# Patient Record
Sex: Female | Born: 1960 | Race: White | Hispanic: No | Marital: Married | Smoking: Never smoker
Health system: Southern US, Community
[De-identification: ages and names within clinical notes are randomized; demographics above are authoritative.]

## PROBLEM LIST (undated history)

## (undated) DIAGNOSIS — T7840XA Allergy, unspecified, initial encounter: Secondary | ICD-10-CM

## (undated) DIAGNOSIS — A64 Unspecified sexually transmitted disease: Secondary | ICD-10-CM

## (undated) DIAGNOSIS — F32A Depression, unspecified: Secondary | ICD-10-CM

## (undated) DIAGNOSIS — IMO0002 Reserved for concepts with insufficient information to code with codable children: Secondary | ICD-10-CM

## (undated) DIAGNOSIS — F329 Major depressive disorder, single episode, unspecified: Secondary | ICD-10-CM

## (undated) DIAGNOSIS — H409 Unspecified glaucoma: Secondary | ICD-10-CM

## (undated) DIAGNOSIS — G43909 Migraine, unspecified, not intractable, without status migrainosus: Secondary | ICD-10-CM

## (undated) DIAGNOSIS — R011 Cardiac murmur, unspecified: Secondary | ICD-10-CM

## (undated) HISTORY — DX: Unspecified glaucoma: H40.9

## (undated) HISTORY — DX: Unspecified sexually transmitted disease: A64

## (undated) HISTORY — DX: Reserved for concepts with insufficient information to code with codable children: IMO0002

## (undated) HISTORY — DX: Allergy, unspecified, initial encounter: T78.40XA

## (undated) HISTORY — DX: Cardiac murmur, unspecified: R01.1

## (undated) HISTORY — DX: Depression, unspecified: F32.A

## (undated) HISTORY — DX: Migraine, unspecified, not intractable, without status migrainosus: G43.909

## (undated) HISTORY — DX: Major depressive disorder, single episode, unspecified: F32.9

## (undated) HISTORY — PX: HERNIA REPAIR: SHX51

---

## 2006-04-14 ENCOUNTER — Other Ambulatory Visit: Admission: RE | Admit: 2006-04-14 | Discharge: 2006-04-14 | Payer: Self-pay | Admitting: Obstetrics & Gynecology

## 2006-04-24 ENCOUNTER — Encounter: Admission: RE | Admit: 2006-04-24 | Discharge: 2006-04-24 | Payer: Self-pay | Admitting: Obstetrics & Gynecology

## 2013-04-20 ENCOUNTER — Ambulatory Visit (INDEPENDENT_AMBULATORY_CARE_PROVIDER_SITE_OTHER): Payer: BC Managed Care – PPO | Admitting: Certified Nurse Midwife

## 2013-04-20 ENCOUNTER — Encounter: Payer: Self-pay | Admitting: Certified Nurse Midwife

## 2013-04-20 VITALS — BP 98/60 | Ht 63.25 in | Wt 126.0 lb

## 2013-04-20 DIAGNOSIS — Z1211 Encounter for screening for malignant neoplasm of colon: Secondary | ICD-10-CM

## 2013-04-20 DIAGNOSIS — N951 Menopausal and female climacteric states: Secondary | ICD-10-CM

## 2013-04-20 DIAGNOSIS — Z0000 Encounter for general adult medical examination without abnormal findings: Secondary | ICD-10-CM

## 2013-04-20 LAB — FOLLICLE STIMULATING HORMONE: FSH: 38.1 m[IU]/mL

## 2013-04-20 LAB — POCT URINALYSIS DIPSTICK
Bilirubin, UA: NEGATIVE
Blood, UA: NEGATIVE
Nitrite, UA: NEGATIVE
Urobilinogen, UA: NEGATIVE

## 2013-04-20 NOTE — Progress Notes (Signed)
52 y.o. MarriedCaucasian female   [id number] here for annual exam. Periods from April 2013 until 12-30-12 no period and February and March periods 2014 heavy. No period for April as of yet. History of irregular menses in past 3 years. No contraception use throughout married life.  Patient has been having night sweats, and occasional hot flashes, with some mood swings.  No thoughts of self harm or others.  Last pap and aex normal per patient.  Complaining of vaginal dryness in the past year.  No other health problems today.     Patient's last menstrual period was 03/10/2013.          Sexually active: yes  The current method of family planning is none.    Exercising: yes  walking, elliptical & weights Last mammogram: 2006 Last pap:4/13 no abnormal Last BMD: none Alcohol: 2-3 glasses of wine a week Tobacco: none Colonoscopy: none   No health maintenance topics applied.  Family History  Problem Relation Age of Onset  . Hypertension Mother   . Hypertension Father   . Thyroid disease Sister   . Cancer Maternal Grandmother 80    colon  . Thyroid disease Maternal Grandmother     thyroid removed  . Stroke Maternal Grandfather 62    There is no problem list on file for this patient.   Past Medical History  Diagnosis Date  . Depression   . Dyspareunia     occ  . Heart murmur   . STD (sexually transmitted disease)     HSV 2    Past Surgical History  Procedure Laterality Date  . Hernia repair      Allergies: Review of patient's allergies indicates no known allergies.  Current Outpatient Prescriptions  Medication Sig Dispense Refill  . B Complex Vitamins (B COMPLEX PO) Take by mouth daily.      . Cholecalciferol (VITAMIN D PO) Take by mouth daily.      . Ibuprofen (ADVIL PO) Take by mouth as needed.       No current facility-administered medications for this visit.    ROS: A comprehensive review of systems was negative.  Exam:    BP 98/60  Ht 5' 3.25" (1.607 m)  Wt 126  lb (57.153 kg)  BMI 22.13 kg/m2  LMP 03/10/2013 Weight change: @WEIGHTCHANGE @ Last 3 height recordings:  Ht Readings from Last 3 Encounters:  04/20/13 5' 3.25" (1.607 m)   General appearance: alert and cooperative Head: Normocephalic, without obvious abnormality, atraumatic Neck: no adenopathy, supple, symmetrical, trachea midline and thyroid not enlarged, symmetric, no tenderness/mass/nodules Lungs: clear to auscultation bilaterally Breasts: normal appearance, no masses or tenderness, No nipple retraction or dimpling Heart: regular rate and rhythm no murmur heard today Abdomen: soft, non-tender; bowel sounds normal; no masses,  no organomegaly Extremities: extremities normal, atraumatic, no cyanosis or edema Skin: Skin color, texture, turgor normal. No rashes or lesions Lymph nodes: Cervical, supraclavicular, and axillary nodes normal. no inguinal nodes palpated Neurologic: Alert and oriented X 3, normal strength and tone. Normal symmetric reflexes. Normal coordination and gait   Pelvic: External genitalia:  no lesions and well estrogenized              Urethra: normal appearing urethra with no masses, tenderness or lesions              Bartholins and Skenes: normal, Bartholin's, Urethra, Skene's normal                 Vagina: normal appearing vagina with  normal color and discharge, no lesions              Cervix: thin prep PAP obtained              Pap taken: yes        Bimanual Exam:  Uterus:  uterus is normal size, shape, consistency and nontender                                      Adnexa:    not indicated and normal adnexa in size, nontender and no masses                                      Rectovaginal: Confirms                                      Anus:  normal sphincter tone, no lesions       A: Normal well woman exam Contraception: none Perimenopausal with irregular cycles Depression? Or perimenopausal mood changes   P: Reviewed health and wellness pertinent to  exam   Discussed perimenopausal changes and cycle expectations given.  Questions addressed.  Given menses calendar with normal and abnormal parameters to report. Lab:FSH, TSH Discussed feelings of depression signs and symptoms, patient not sure, but feels she has not had this type of mood changes.  Discusssed risks and benefits of medication, patient does not feel needed at this time. Given information on counseling resources.  Patient to advise if symptoms increase and come in to discuss.  Instructed to seek immediate assistance if thoughts of self harm or others. Pap yearly or as indicated Mammogram yearly Colonoscopy risks and benefits discussed.  Request referral for scheduling.  Rv as above and prn     An After Visit Summary was printed and given to the patient.  Reviewed, TL

## 2013-04-20 NOTE — Patient Instructions (Signed)

## 2013-04-27 ENCOUNTER — Telehealth: Payer: Self-pay | Admitting: Certified Nurse Midwife

## 2013-04-27 NOTE — Telephone Encounter (Signed)
Patient returning Sally's call re: results. Kennon Rounds out sick today.

## 2013-04-27 NOTE — Telephone Encounter (Signed)
Let her know that the blood work is normal, but I would like her Vitamin level to be 50 range.  Start on 800 IU daily.

## 2013-04-28 NOTE — Telephone Encounter (Signed)
We discussed that the symptoms maybe perimenopausal changes, the [facility] does not reflect menopause only where here level is.  We also discussed depressions symptoms, which she was to schedule a follow up visit to discuss possible medication use.

## 2013-04-28 NOTE — Telephone Encounter (Signed)
Patient given response per D. Leonard,CNM, patient states understands and will call back when ready for any medication given.

## 2013-04-28 NOTE — Telephone Encounter (Signed)
Patient notified of lab results and explained the Vitamin D level also. Patient is concerned of her FSH level . Do you have any response for this for patient since she is having some menopausal symptoms.SUE

## 2013-05-07 ENCOUNTER — Encounter: Payer: Self-pay | Admitting: Certified Nurse Midwife

## 2014-03-14 ENCOUNTER — Telehealth (HOSPITAL_COMMUNITY): Payer: Self-pay | Admitting: *Deleted

## 2014-03-14 NOTE — Telephone Encounter (Signed)
Opened in error

## 2014-10-21 ENCOUNTER — Other Ambulatory Visit (INDEPENDENT_AMBULATORY_CARE_PROVIDER_SITE_OTHER): Payer: BC Managed Care – PPO

## 2014-10-21 ENCOUNTER — Encounter: Payer: Self-pay | Admitting: Internal Medicine

## 2014-10-21 ENCOUNTER — Ambulatory Visit (INDEPENDENT_AMBULATORY_CARE_PROVIDER_SITE_OTHER): Payer: BC Managed Care – PPO | Admitting: Internal Medicine

## 2014-10-21 VITALS — BP 108/56 | HR 61 | Temp 98.0°F | Resp 16 | Ht 63.0 in | Wt 125.0 lb

## 2014-10-21 DIAGNOSIS — Z0000 Encounter for general adult medical examination without abnormal findings: Secondary | ICD-10-CM

## 2014-10-21 DIAGNOSIS — B009 Herpesviral infection, unspecified: Secondary | ICD-10-CM

## 2014-10-21 DIAGNOSIS — Z23 Encounter for immunization: Secondary | ICD-10-CM

## 2014-10-21 LAB — CBC
HCT: 40 % (ref 36.0–46.0)
Hemoglobin: 13 g/dL (ref 12.0–15.0)
MCHC: 32.6 g/dL (ref 30.0–36.0)
MCV: 88.4 fl (ref 78.0–100.0)
PLATELETS: 205 10*3/uL (ref 150.0–400.0)
RBC: 4.52 Mil/uL (ref 3.87–5.11)
RDW: 12.9 % (ref 11.5–15.5)
WBC: 5.3 10*3/uL (ref 4.0–10.5)

## 2014-10-21 LAB — BASIC METABOLIC PANEL
BUN: 16 mg/dL (ref 6–23)
CHLORIDE: 104 meq/L (ref 96–112)
CO2: 30 mEq/L (ref 19–32)
Calcium: 9.9 mg/dL (ref 8.4–10.5)
Creatinine, Ser: 0.7 mg/dL (ref 0.4–1.2)
GFR: 101.7 mL/min (ref >60.00)
Glucose, Bld: 88 mg/dL (ref 70–99)
POTASSIUM: 4.4 meq/L (ref 3.5–5.1)
SODIUM: 139 meq/L (ref 135–145)

## 2014-10-21 LAB — LIPID PANEL
CHOL/HDL RATIO: 3
CHOLESTEROL: 191 mg/dL (ref 0–200)
HDL: 59.1 mg/dL (ref >39.00)
LDL Cholesterol: 114 mg/dL — ABNORMAL HIGH (ref 0–99)
NonHDL: 131.9
TRIGLYCERIDES: 92 mg/dL (ref 0.0–149.0)
VLDL: 18.4 mg/dL (ref 0.0–40.0)

## 2014-10-21 MED ORDER — ACYCLOVIR 400 MG PO TABS: 400.0000 mg | ORAL_TABLET | Freq: Every day | ORAL | Status: DC

## 2014-10-21 MED ORDER — ESTRADIOL 0.1 MG/GM VA CREA: 1.0000 | TOPICAL_CREAM | Freq: Every day | VAGINAL | Status: DC

## 2014-10-21 NOTE — Patient Instructions (Addendum)
We have sent in estrogen cream that you can use daily to help regain some moisture in your lining to help with some of the discomfort.   For the herpes we have sent in acyclovir. You can use the medicine just when you think a flare is coming or everyday for prevention of flares. To take it for a flare take 2 pills (800 mg) twice a day for 5 days starting when you feel like you are going to have a flare.   If you want to take it daily to prevent an outbreak take 1 pill daily.   Come back in about 1 year or sooner if you have problems or are sick.   We will check your blood today and call you with the results. We will send in for the colon cancer screening you can do from home and you should be getting that in the next couple of weeks.

## 2014-10-21 NOTE — Assessment & Plan Note (Signed)
Estrogen vaginal cream for dryness in the setting of menopause. Flu shot declined. Cologuard ordered for colon cancer screening. She does not wish to get mammogram due to the discomfort and advised her that this would be recommended.

## 2014-10-21 NOTE — Progress Notes (Signed)
Pre visit review using our clinic review tool, if applicable. No additional management support is needed unless otherwise documented below in the visit note. 

## 2014-10-21 NOTE — Assessment & Plan Note (Signed)
She does have recurrent flares and rx for acyclovir with instruction for either daily usage or high dose with flares triggered by her. Her partner already has herpes so prevention of spread is not an issue.

## 2014-10-21 NOTE — Progress Notes (Signed)
   Subjective:    Patient ID: Sheila Cox, female    DOB: 1961-06-24, 53 y.o.   MRN: [id number]  HPI The patient is a 53 YO female who is coming in today to establish care. She has PMH of herpes. She is going through menopause currently and having some vaginal dryness which is causing some pain with intercourse which is bothersome to her. She also has about 1 flare of her herpes per month which is bothersome to her. She denies current outbreak. She denies chest pain, SOB, abdominal pain, constipation, diarrhea. She has never had colonoscopy and is not sure she wants to do it.   Review of Systems  Constitutional: Negative for fever, activity change, appetite change and fatigue.  HENT: Negative.   Eyes: Negative.   Respiratory: Negative for cough, chest tightness, shortness of breath and wheezing.   Cardiovascular: Negative for chest pain, palpitations and leg swelling.  Gastrointestinal: Negative for abdominal pain, diarrhea, constipation and abdominal distention.  Genitourinary: Positive for genital sores and dyspareunia.  Musculoskeletal: Negative.   Skin: Negative.   Neurological: Negative.   Psychiatric/Behavioral: Negative.       Objective:   Physical Exam  Constitutional: She is oriented to person, place, and time. She appears well-developed and well-nourished. No distress.  HENT:  Head: Normocephalic and atraumatic.  Eyes: EOM are normal.  Neck: Normal range of motion.  Cardiovascular: Normal rate.   Pulmonary/Chest: Effort normal and breath sounds normal. No respiratory distress. She has no wheezes. She has no rales.  Abdominal: Soft. Bowel sounds are normal.  Neurological: She is alert and oriented to person, place, and time. Coordination normal.  Skin: Skin is warm and dry.   Filed Vitals:   10/21/14 1358  BP: 108/56  Pulse: 61  Temp: 98 F (36.7 C)  TempSrc: Oral  Resp: 16  Height: 5\' 3"  (1.6 m)  Weight: 125 lb (56.7 kg)  SpO2: 97%      Assessment & Plan:    Patient declines flu shot.

## 2014-10-31 ENCOUNTER — Encounter: Payer: Self-pay | Admitting: Internal Medicine

## 2014-12-15 LAB — COLOGUARD

## 2015-02-08 DIAGNOSIS — Z0279 Encounter for issue of other medical certificate: Secondary | ICD-10-CM

## 2015-07-10 ENCOUNTER — Telehealth: Payer: Self-pay

## 2015-07-10 ENCOUNTER — Ambulatory Visit (INDEPENDENT_AMBULATORY_CARE_PROVIDER_SITE_OTHER): Payer: BLUE CROSS/BLUE SHIELD | Admitting: Internal Medicine

## 2015-07-10 VITALS — BP 100/62 | HR 65 | Temp 98.1°F | Resp 11 | Ht 63.0 in | Wt 127.0 lb

## 2015-07-10 DIAGNOSIS — Z1239 Encounter for other screening for malignant neoplasm of breast: Secondary | ICD-10-CM | POA: Diagnosis not present

## 2015-07-10 DIAGNOSIS — H9313 Tinnitus, bilateral: Secondary | ICD-10-CM

## 2015-07-10 DIAGNOSIS — H9319 Tinnitus, unspecified ear: Secondary | ICD-10-CM | POA: Insufficient documentation

## 2015-07-10 DIAGNOSIS — B078 Other viral warts: Secondary | ICD-10-CM | POA: Insufficient documentation

## 2015-07-10 NOTE — Patient Instructions (Signed)
We have cleaned out the ear and you can even notice that the ringing may get better in the next couple of days. We have put in the order for the hearing test.   We will also get you in with the dermatologist to get the warts taken care of.

## 2015-07-10 NOTE — Progress Notes (Signed)
Pre visit review using our clinic review tool, if applicable. No additional management support is needed unless otherwise documented below in the visit note. 

## 2015-07-10 NOTE — Assessment & Plan Note (Signed)
Refer to dermatology for removal. Right hand.

## 2015-07-10 NOTE — Assessment & Plan Note (Signed)
Right ear wax disimpacted. Still with some changes. Order for audiology evaluation. Talked with her about the fact that some of this could still clear in the next couple days.

## 2015-07-10 NOTE — Telephone Encounter (Signed)
Spoke with pt about mammogram that was due. Pt stated she would be more than happy to get one. I have put in an order for it to be scheduled she is aware that someone from the center will be contacting her

## 2015-07-10 NOTE — Addendum Note (Signed)
Addended by: Edison Pace E on: 07/10/2015 02:00 PM   Modules accepted: Orders

## 2015-07-10 NOTE — Progress Notes (Signed)
   Subjective:    Patient ID: Sheila Cox, female    DOB: 04/15/1961, 54 y.o.   MRN: [id number]  HPI The patient is a 54 YO female coming in for right ear ringing and obstruction. Started 1-2 weeks ago when she traveled by plane. Now no change in hearing. Tinnitus all the time. Used to be only at night and very soft. Is bothersome to her. Has not tried anything for it.  Next problem is some warts on her right hand. Developed in the last 6 months. Has had them frozen in the past. Several catch on things and bother her. Has not tried anything for it.   Review of Systems  Constitutional: Negative for activity change, appetite change, fatigue and unexpected weight change.  HENT: Positive for ear pain and tinnitus. Negative for congestion, ear discharge, hearing loss and trouble swallowing.   Eyes: Negative.   Respiratory: Negative for cough, chest tightness, shortness of breath and wheezing.   Cardiovascular: Negative for chest pain, palpitations and leg swelling.  Gastrointestinal: Negative.   Skin:       Warts right hand      Objective:   Physical Exam  Constitutional: She appears well-developed and well-nourished.  HENT:  Head: Normocephalic and atraumatic.  Left Ear: External ear normal.  Right ear impacted with wax, TM normal after disimpaction  Eyes: EOM are normal.  Cardiovascular: Normal rate and regular rhythm.   Pulmonary/Chest: Effort normal and breath sounds normal.  Abdominal: Soft.  Skin:  Several warts on the right hand on thumb and 5th finger   Filed Vitals:   07/10/15 1001  BP: 100/62  Pulse: 65  Temp: 98.1 F (36.7 C)  TempSrc: Oral  Resp: 11  Height: 5\' 3"  (1.6 m)  Weight: 127 lb (57.607 kg)  SpO2: 97%      Assessment & Plan:

## 2015-10-24 ENCOUNTER — Encounter: Payer: Self-pay | Admitting: Family

## 2015-10-24 ENCOUNTER — Ambulatory Visit (INDEPENDENT_AMBULATORY_CARE_PROVIDER_SITE_OTHER): Payer: BLUE CROSS/BLUE SHIELD | Admitting: Family

## 2015-10-24 VITALS — BP 134/84 | HR 88 | Temp 99.2°F | Resp 18 | Ht 63.0 in | Wt 125.0 lb

## 2015-10-24 DIAGNOSIS — J0100 Acute maxillary sinusitis, unspecified: Secondary | ICD-10-CM | POA: Diagnosis not present

## 2015-10-24 DIAGNOSIS — J019 Acute sinusitis, unspecified: Secondary | ICD-10-CM | POA: Insufficient documentation

## 2015-10-24 MED ORDER — HYDROCODONE-HOMATROPINE 5-1.5 MG/5ML PO SYRP: 5.0000 mL | ORAL_SOLUTION | Freq: Three times a day (TID) | ORAL | Status: DC | PRN

## 2015-10-24 MED ORDER — AMOXICILLIN-POT CLAVULANATE 875-125 MG PO TABS: 1.0000 | ORAL_TABLET | Freq: Two times a day (BID) | ORAL | Status: DC

## 2015-10-24 NOTE — Assessment & Plan Note (Signed)
Symptoms and exam consistent with acute bacterial sinusitis. Start Augmentin. Start Hycodan as needed for cough and sleep. Continue over-the-counter medications as needed for symptom relief and supportive care. Follow up if symptoms worsen or fail to improve.

## 2015-10-24 NOTE — Progress Notes (Signed)
   Subjective:    Patient ID: Sheila Cox, female    DOB: 1961/11/08, 54 y.o.   MRN: [id number]  Chief Complaint  Patient presents with  . Nasal Congestion    a little over a week ago developed a cold, has congestion so bad to where she couldn't breathe out of her nose at all    HPI:  Sheila Cox is a 54 y.o. female who  has a past medical history of Depression; Dyspareunia; Heart murmur; and STD (sexually transmitted disease). and presents today for an acute office visit.   This is a new problem. Associated symptoms of congestion, sinus pressure, productive cough with purulent sputum, and dental pain for about 1 week. Modifying factors include Mucinex, saline sprays, and decongestants which did help with some of her symptoms, however her illness continues. Denies any recent antibiotics.  No Known Allergies   Current Outpatient Prescriptions on File Prior to Visit  Medication Sig Dispense Refill  . B Complex Vitamins (B COMPLEX PO) Take by mouth daily.    . Cholecalciferol (VITAMIN D PO) Take by mouth daily.    . Ibuprofen (ADVIL PO) Take by mouth as needed.     No current facility-administered medications on file prior to visit.     Review of Systems  Constitutional: Negative for fever and chills.  HENT: Positive for congestion and sinus pressure. Negative for sneezing and sore throat.   Respiratory: Positive for cough. Negative for chest tightness and shortness of breath.   Neurological: Positive for headaches.      Objective:    BP 134/84 mmHg  Pulse 88  Temp(Src) 99.2 F (37.3 C) (Oral)  Resp 18  Ht 5\' 3"  (1.6 m)  Wt 125 lb (56.7 kg)  BMI 22.15 kg/m2  SpO2 98% Nursing note and vital signs reviewed.  Physical Exam  Constitutional: She is oriented to person, place, and time. She appears well-developed and well-nourished.  HENT:  Right Ear: Hearing, tympanic membrane, external ear and ear canal normal.  Left Ear: Hearing, tympanic membrane, external ear  and ear canal normal.  Nose: Right sinus exhibits maxillary sinus tenderness and frontal sinus tenderness. Left sinus exhibits maxillary sinus tenderness and frontal sinus tenderness.  Mouth/Throat: Uvula is midline, oropharynx is clear and moist and mucous membranes are normal.  Neck: Neck supple.  Cardiovascular: Normal rate, regular rhythm, normal heart sounds and intact distal pulses.   Pulmonary/Chest: Effort normal and breath sounds normal.  Neurological: She is alert and oriented to person, place, and time.  Skin: Skin is warm and dry.       Assessment & Plan:   Problem List Items Addressed This Visit      Respiratory   Sinusitis, acute - Primary    Symptoms and exam consistent with acute bacterial sinusitis. Start Augmentin. Start Hycodan as needed for cough and sleep. Continue over-the-counter medications as needed for symptom relief and supportive care. Follow up if symptoms worsen or fail to improve.      Relevant Medications   amoxicillin-clavulanate (AUGMENTIN) 875-125 MG tablet   HYDROcodone-homatropine (HYCODAN) 5-1.5 MG/5ML syrup

## 2015-10-24 NOTE — Patient Instructions (Signed)
Thank you for choosing [facility].  Summary/Instructions:  Your prescription(s) have been submitted to your pharmacy or been printed and provided for you. Please take as directed and contact our office if you believe you are having problem(s) with the medication(s) or have any questions.  If your symptoms worsen or fail to improve, please contact our office for further instruction, or in case of emergency go directly to the emergency room at the closest medical facility.   General Recommendations:    Please drink plenty of fluids.  Get plenty of rest   Sleep in humidified air  Use saline nasal sprays  Netti pot   OTC Medications:  Decongestants - helps relieve congestion   Flonase (generic fluticasone) or Nasacort (generic triamcinolone) - please make sure to use the "cross-over" technique at a 45 degree angle towards the opposite eye as opposed to straight up the nasal passageway.   Sudafed (generic pseudoephedrine - Note this is the one that is available behind the pharmacy counter); Products with phenylephrine (-PE) may also be used but is often not as effective as pseudoephedrine.   If you have HIGH BLOOD PRESSURE - Coricidin HBP; AVOID any product that is -D as this contains pseudoephedrine which may increase your blood pressure.  Afrin (oxymetazoline) every 6-8 hours for up to 3 days.   Allergies - helps relieve runny nose, itchy eyes and sneezing   Claritin (generic loratidine), Allegra (fexofenidine), or Zyrtec (generic cyrterizine) for runny nose. These medications should not cause drowsiness.  Note - Benadryl (generic diphenhydramine) may be used however may cause drowsiness  Cough -   Delsym or Robitussin (generic dextromethorphan)  Expectorants - helps loosen mucus to ease removal   Mucinex (generic guaifenesin) as directed on the package.  Headaches / General Aches   Tylenol (generic acetaminophen) - DO NOT EXCEED 3 grams (3,000 mg) in a 24  hour time period  Advil/Motrin (generic ibuprofen)   Sore Throat -   Salt water gargle   Chloraseptic (generic benzocaine) spray or lozenges / Sucrets (generic dyclonine)   Sinusitis, Adult Sinusitis is redness, soreness, and inflammation of the paranasal sinuses. Paranasal sinuses are air pockets within the bones of your face. They are located beneath your eyes, in the middle of your forehead, and above your eyes. In healthy paranasal sinuses, mucus is able to drain out, and air is able to circulate through them by way of your nose. However, when your paranasal sinuses are inflamed, mucus and air can become trapped. This can allow bacteria and other germs to grow and cause infection. Sinusitis can develop quickly and last only a short time (acute) or continue over a long period (chronic). Sinusitis that lasts for more than 12 weeks is considered chronic. CAUSES Causes of sinusitis include:  Allergies.  Structural abnormalities, such as displacement of the cartilage that separates your nostrils (deviated septum), which can decrease the air flow through your nose and sinuses and affect sinus drainage.  Functional abnormalities, such as when the small hairs (cilia) that line your sinuses and help remove mucus do not work properly or are not present. SIGNS AND SYMPTOMS Symptoms of acute and chronic sinusitis are the same. The primary symptoms are pain and pressure around the affected sinuses. Other symptoms include:  Upper toothache.  Earache.  Headache.  Bad breath.  Decreased sense of smell and taste.  A cough, which worsens when you are lying flat.  Fatigue.  Fever.  Thick drainage from your nose, which often is green and  may contain pus (purulent).  Swelling and warmth over the affected sinuses. DIAGNOSIS Your health care provider will perform a physical exam. During your exam, your health care provider may perform any of the following to help determine if you have acute  sinusitis or chronic sinusitis:  Look in your nose for signs of abnormal growths in your nostrils (nasal polyps).  Tap over the affected sinus to check for signs of infection.  View the inside of your sinuses using an imaging device that has a light attached (endoscope). If your health care provider suspects that you have chronic sinusitis, one or more of the following tests may be recommended:  Allergy tests.  Nasal culture. A sample of mucus is taken from your nose, sent to a lab, and screened for bacteria.  Nasal cytology. A sample of mucus is taken from your nose and examined by your health care provider to determine if your sinusitis is related to an allergy. TREATMENT Most cases of acute sinusitis are related to a viral infection and will resolve on their own within 10 days. Sometimes, medicines are prescribed to help relieve symptoms of both acute and chronic sinusitis. These may include pain medicines, decongestants, nasal steroid sprays, or saline sprays. However, for sinusitis related to a bacterial infection, your health care provider will prescribe antibiotic medicines. These are medicines that will help kill the bacteria causing the infection. Rarely, sinusitis is caused by a fungal infection. In these cases, your health care provider will prescribe antifungal medicine. For some cases of chronic sinusitis, surgery is needed. Generally, these are cases in which sinusitis recurs more than 3 times per year, despite other treatments. HOME CARE INSTRUCTIONS  Drink plenty of water. Water helps thin the mucus so your sinuses can drain more easily.  Use a humidifier.  Inhale steam 3-4 times a day (for example, sit in the bathroom with the shower running).  Apply a warm, moist washcloth to your face 3-4 times a day, or as directed by your health care provider.  Use saline nasal sprays to help moisten and clean your sinuses.  Take medicines only as directed by your health care  provider.  If you were prescribed either an antibiotic or antifungal medicine, finish it all even if you start to feel better. SEEK IMMEDIATE MEDICAL CARE IF:  You have increasing pain or severe headaches.  You have nausea, vomiting, or drowsiness.  You have swelling around your face.  You have vision problems.  You have a stiff neck.  You have difficulty breathing.   This information is not intended to replace advice given to you by your health care provider. Make sure you discuss any questions you have with your health care provider.   Document Released: 12/16/2005 Document Revised: 01/06/2015 Document Reviewed: 12/31/2011 Elsevier Interactive Patient Education [employer].

## 2015-12-05 ENCOUNTER — Ambulatory Visit (INDEPENDENT_AMBULATORY_CARE_PROVIDER_SITE_OTHER): Payer: BLUE CROSS/BLUE SHIELD

## 2015-12-05 ENCOUNTER — Telehealth: Payer: Self-pay

## 2015-12-05 DIAGNOSIS — Z23 Encounter for immunization: Secondary | ICD-10-CM | POA: Diagnosis not present

## 2015-12-05 MED ORDER — VALACYCLOVIR HCL 1 G PO TABS: 1000.0000 mg | ORAL_TABLET | Freq: Two times a day (BID) | ORAL | Status: DC

## 2015-12-05 NOTE — Telephone Encounter (Signed)
Patient advised that rx was sent to [facility] [place] [address]

## 2015-12-05 NOTE — Telephone Encounter (Signed)
Patient came in for flu vaccine, and she is requesting refill on acyclovir 400mg ---med appears in her history but she hasnt taken it recently---she is currently experiencing an episode of herpes and would like it refilled today if possible----just for today, she would like this rx to go to [facility]----please advise, i will call her back, thanks

## 2015-12-05 NOTE — Telephone Encounter (Signed)
Have sent in valtrex (very similar but more convenient as only twice a day). Take 1 pill twice a day while during flare.

## 2015-12-05 NOTE — Telephone Encounter (Signed)
patie

## 2015-12-05 NOTE — Addendum Note (Signed)
Addended by: Pricilla Holm A on: 12/05/2015 03:43 PM   Modules accepted: Orders

## 2016-02-05 ENCOUNTER — Ambulatory Visit (INDEPENDENT_AMBULATORY_CARE_PROVIDER_SITE_OTHER): Payer: BLUE CROSS/BLUE SHIELD | Admitting: Internal Medicine

## 2016-02-05 ENCOUNTER — Encounter: Payer: Self-pay | Admitting: Internal Medicine

## 2016-02-05 VITALS — BP 120/78 | HR 65 | Temp 98.3°F | Resp 12 | Ht 63.0 in | Wt 123.0 lb

## 2016-02-05 DIAGNOSIS — Z0000 Encounter for general adult medical examination without abnormal findings: Secondary | ICD-10-CM

## 2016-02-05 NOTE — Progress Notes (Signed)
Pre visit review using our clinic review tool, if applicable. No additional management support is needed unless otherwise documented below in the visit note. 

## 2016-02-05 NOTE — Patient Instructions (Signed)
We will check the blood work so come back some morning fasting. We have filled out your form and given it back to you.  Come back next year if you are doing well, call us sooner with any problems and questions.   I wish your son good luck in his journey.   Health Maintenance, Female Adopting a healthy lifestyle and getting preventive care can go a long way to promote health and wellness. Talk with your health care provider about what schedule of regular examinations is right for you. This is a good chance for you to check in with your provider about disease prevention and staying healthy. In between checkups, there are plenty of things you can do on your own. Experts have done a lot of research about which lifestyle changes and preventive measures are most likely to keep you healthy. Ask your health care provider for more information. WEIGHT AND DIET  Eat a healthy diet  Be sure to include plenty of vegetables, fruits, low-fat dairy products, and lean protein.  Do not eat a lot of foods high in solid fats, added sugars, or salt.  Get regular exercise. This is one of the most important things you can do for your health.  Most adults should exercise for at least 150 minutes each week. The exercise should increase your heart rate and make you sweat (moderate-intensity exercise).  Most adults should also do strengthening exercises at least twice a week. This is in addition to the moderate-intensity exercise.  Maintain a healthy weight  Body mass index (BMI) is a measurement that can be used to identify possible weight problems. It estimates body fat based on height and weight. Your health care provider can help determine your BMI and help you achieve or maintain a healthy weight.  For females 56 years of age and older:   A BMI below 18.5 is considered underweight.  A BMI of 18.5 to 24.9 is normal.  A BMI of 25 to 29.9 is considered overweight.  A BMI of 30 and above is considered  obese.  Watch levels of cholesterol and blood lipids  You should start having your blood tested for lipids and cholesterol at 54 years of age, then have this test every 5 years.  You may need to have your cholesterol levels checked more often if:  Your lipid or cholesterol levels are high.  You are older than 54 years of age.  You are at high risk for heart disease.  CANCER SCREENING   Lung Cancer  Lung cancer screening is recommended for adults 47-65 years old who are at high risk for lung cancer because of a history of smoking.  A yearly low-dose CT scan of the lungs is recommended for people who:  Currently smoke.  Have quit within the past 15 years.  Have at least a 30-pack-year history of smoking. A pack year is smoking an average of one pack of cigarettes a day for 1 year.  Yearly screening should continue until it has been 15 years since you quit.  Yearly screening should stop if you develop a health problem that would prevent you from having lung cancer treatment.  Breast Cancer  Practice breast self-awareness. This means understanding how your breasts normally appear and feel.  It also means doing regular breast self-exams. Let your health care provider know about any changes, no matter how small.  If you are in your 20s or 30s, you should have a clinical breast exam (CBE) by a health  care provider every 1-3 years as part of a regular health exam.  If you are 34 or older, have a CBE every year. Also consider having a breast X-ray (mammogram) every year.  If you have a family history of breast cancer, talk to your health care provider about genetic screening.  If you are at high risk for breast cancer, talk to your health care provider about having an MRI and a mammogram every year.  Breast cancer gene (BRCA) assessment is recommended for women who have family members with BRCA-related cancers. BRCA-related cancers  include:  Breast.  Ovarian.  Tubal.  Peritoneal cancers.  Results of the assessment will determine the need for genetic counseling and BRCA1 and BRCA2 testing. Cervical Cancer Your health care provider may recommend that you be screened regularly for cancer of the pelvic organs (ovaries, uterus, and vagina). This screening involves a pelvic examination, including checking for microscopic changes to the surface of your cervix (Pap test). You may be encouraged to have this screening done every 3 years, beginning at age 21.  For women ages 89-65, health care providers may recommend pelvic exams and Pap testing every 3 years, or they may recommend the Pap and pelvic exam, combined with testing for human papilloma virus (HPV), every 5 years. Some types of HPV increase your risk of cervical cancer. Testing for HPV may also be done on women of any age with unclear Pap test results.  Other health care providers may not recommend any screening for nonpregnant women who are considered low risk for pelvic cancer and who do not have symptoms. Ask your health care provider if a screening pelvic exam is right for you.  If you have had past treatment for cervical cancer or a condition that could lead to cancer, you need Pap tests and screening for cancer for at least 20 years after your treatment. If Pap tests have been discontinued, your risk factors (such as having a new sexual partner) need to be reassessed to determine if screening should resume. Some women have medical problems that increase the chance of getting cervical cancer. In these cases, your health care provider may recommend more frequent screening and Pap tests. Colorectal Cancer  This type of cancer can be detected and often prevented.  Routine colorectal cancer screening usually begins at 54 years of age and continues through 54 years of age.  Your health care provider may recommend screening at an earlier age if you have risk factors for  colon cancer.  Your health care provider may also recommend using home test kits to check for hidden blood in the stool.  A small camera at the end of a tube can be used to examine your colon directly (sigmoidoscopy or colonoscopy). This is done to check for the earliest forms of colorectal cancer.  Routine screening usually begins at age 36.  Direct examination of the colon should be repeated every 5-10 years through 54 years of age. However, you may need to be screened more often if early forms of precancerous polyps or small growths are found. Skin Cancer  Check your skin from head to toe regularly.  Tell your health care provider about any new moles or changes in moles, especially if there is a change in a mole's shape or color.  Also tell your health care provider if you have a mole that is larger than the size of a pencil eraser.  Always use sunscreen. Apply sunscreen liberally and repeatedly throughout the day.  Protect  yourself by wearing long sleeves, pants, a wide-brimmed hat, and sunglasses whenever you are outside. HEART DISEASE, DIABETES, AND HIGH BLOOD PRESSURE   High blood pressure causes heart disease and increases the risk of stroke. High blood pressure is more likely to develop in:  People who have blood pressure in the high end of the normal range (130-139/85-89 mm Hg).  People who are overweight or obese.  People who are African American.  If you are 54-65 years of age, have your blood pressure checked every 3-5 years. If you are 81 years of age or older, have your blood pressure checked every year. You should have your blood pressure measured twice--once when you are at a hospital or clinic, and once when you are not at a hospital or clinic. Record the average of the two measurements. To check your blood pressure when you are not at a hospital or clinic, you can use:  An automated blood pressure machine at a pharmacy.  A home blood pressure monitor.  If you  are between 81 years and 18 years old, ask your health care provider if you should take aspirin to prevent strokes.  Have regular diabetes screenings. This involves taking a blood sample to check your fasting blood sugar level.  If you are at a normal weight and have a low risk for diabetes, have this test once every three years after 54 years of age.  If you are overweight and have a high risk for diabetes, consider being tested at a younger age or more often. PREVENTING INFECTION  Hepatitis B  If you have a higher risk for hepatitis B, you should be screened for this virus. You are considered at high risk for hepatitis B if:  You were born in a country where hepatitis B is common. Ask your health care provider which countries are considered high risk.  Your parents were born in a high-risk country, and you have not been immunized against hepatitis B (hepatitis B vaccine).  You have HIV or AIDS.  You use needles to inject street drugs.  You live with someone who has hepatitis B.  You have had sex with someone who has hepatitis B.  You get hemodialysis treatment.  You take certain medicines for conditions, including cancer, organ transplantation, and autoimmune conditions. Hepatitis C  Blood testing is recommended for:  Everyone born from [birth year] through 1964.  Anyone with known risk factors for hepatitis C. Sexually transmitted infections (STIs)  You should be screened for sexually transmitted infections (STIs) including gonorrhea and chlamydia if:  You are sexually active and are younger than 54 years of age.  You are older than 54 years of age and your health care provider tells you that you are at risk for this type of infection.  Your sexual activity has changed since you were last screened and you are at an increased risk for chlamydia or gonorrhea. Ask your health care provider if you are at risk.  If you do not have HIV, but are at risk, it may be recommended that you  take a prescription medicine daily to prevent HIV infection. This is called pre-exposure prophylaxis (PrEP). You are considered at risk if:  You are sexually active and do not regularly use condoms or know the HIV status of your partner(s).  You take drugs by injection.  You are sexually active with a partner who has HIV. Talk with your health care provider about whether you are at high risk of being infected with  HIV. If you choose to begin PrEP, you should first be tested for HIV. You should then be tested every 3 months for as long as you are taking PrEP.  PREGNANCY   If you are premenopausal and you may become pregnant, ask your health care provider about preconception counseling.  If you may become pregnant, take 400 to 800 micrograms (mcg) of folic acid every day.  If you want to prevent pregnancy, talk to your health care provider about birth control (contraception). OSTEOPOROSIS AND MENOPAUSE   Osteoporosis is a disease in which the bones lose minerals and strength with aging. This can result in serious bone fractures. Your risk for osteoporosis can be identified using a bone density scan.  If you are 53 years of age or older, or if you are at risk for osteoporosis and fractures, ask your health care provider if you should be screened.  Ask your health care provider whether you should take a calcium or vitamin D supplement to lower your risk for osteoporosis.  Menopause may have certain physical symptoms and risks.  Hormone replacement therapy may reduce some of these symptoms and risks. Talk to your health care provider about whether hormone replacement therapy is right for you.  HOME CARE INSTRUCTIONS   Schedule regular health, dental, and eye exams.  Stay current with your immunizations.   Do not use any tobacco products including cigarettes, chewing tobacco, or electronic cigarettes.  If you are pregnant, do not drink alcohol.  If you are breastfeeding, limit how  much and how often you drink alcohol.  Limit alcohol intake to no more than 1 drink per day for nonpregnant women. One drink equals 12 ounces of beer, 5 ounces of wine, or 1 ounces of hard liquor.  Do not use street drugs.  Do not share needles.  Ask your health care provider for help if you need support or information about quitting drugs.  Tell your health care provider if you often feel depressed.  Tell your health care provider if you have ever been abused or do not feel safe at home.   This information is not intended to replace advice given to you by your health care provider. Make sure you discuss any questions you have with your health care provider.   Document Released: 07/01/2011 Document Revised: 01/06/2015 Document Reviewed: 11/17/2013 Elsevier Interactive Patient Education [employer].

## 2016-02-08 ENCOUNTER — Other Ambulatory Visit (INDEPENDENT_AMBULATORY_CARE_PROVIDER_SITE_OTHER): Payer: BLUE CROSS/BLUE SHIELD

## 2016-02-08 ENCOUNTER — Encounter: Payer: Self-pay | Admitting: Internal Medicine

## 2016-02-08 DIAGNOSIS — Z0000 Encounter for general adult medical examination without abnormal findings: Secondary | ICD-10-CM | POA: Diagnosis not present

## 2016-02-08 LAB — COMPREHENSIVE METABOLIC PANEL
ALBUMIN: 4.5 g/dL (ref 3.5–5.2)
ALK PHOS: 48 U/L (ref 39–117)
ALT: 20 U/L (ref 0–35)
AST: 19 U/L (ref 0–37)
BILIRUBIN TOTAL: 0.7 mg/dL (ref 0.2–1.2)
BUN: 16 mg/dL (ref 6–23)
CO2: 34 mEq/L — ABNORMAL HIGH (ref 19–32)
CREATININE: 0.69 mg/dL (ref 0.40–1.20)
Calcium: 10.2 mg/dL (ref 8.4–10.5)
Chloride: 104 mEq/L (ref 96–112)
GFR: 94.46 mL/min (ref >60.00)
Glucose, Bld: 94 mg/dL (ref 70–99)
POTASSIUM: 5 meq/L (ref 3.5–5.1)
SODIUM: 141 meq/L (ref 135–145)
TOTAL PROTEIN: 7.4 g/dL (ref 6.0–8.3)

## 2016-02-08 LAB — LIPID PANEL
Cholesterol: 201 mg/dL — ABNORMAL HIGH (ref 0–200)
HDL: 64.3 mg/dL (ref >39.00)
LDL Cholesterol: 121 mg/dL — ABNORMAL HIGH (ref 0–99)
NONHDL: 137
Total CHOL/HDL Ratio: 3
Triglycerides: 78 mg/dL (ref 0.0–149.0)
VLDL: 15.6 mg/dL (ref 0.0–40.0)

## 2016-02-08 LAB — FOLLICLE STIMULATING HORMONE: FSH: 110.9 m[IU]/mL

## 2016-02-08 LAB — HEPATITIS C ANTIBODY: HCV AB: NEGATIVE

## 2016-02-08 NOTE — Assessment & Plan Note (Signed)
Reviewed cologuard results, colonoscopy due 2018. Checking labs, non-smoker and exercises. Needs mammogram and doing hep c screening. Talked to her about sun protection.

## 2016-02-08 NOTE — Progress Notes (Signed)
   Subjective:    Patient ID: Sheila Cox, female    DOB: 06/29/1961, 55 y.o.   MRN: [id number]  HPI The patient is a 55 YO female coming in for wellness. Is thinking she may be going through menopause.   PMH, [facility], social history reviewed and updated.   Review of Systems  Constitutional: Negative for fever, activity change, appetite change and fatigue.  HENT: Negative.   Eyes: Negative.   Respiratory: Negative for cough, chest tightness, shortness of breath and wheezing.   Cardiovascular: Negative for chest pain, palpitations and leg swelling.  Gastrointestinal: Negative for abdominal pain, diarrhea, constipation and abdominal distention.  Musculoskeletal: Negative.   Skin: Negative.   Neurological: Negative.   Psychiatric/Behavioral: Negative.       Objective:   Physical Exam  Constitutional: She is oriented to person, place, and time. She appears well-developed and well-nourished. No distress.  HENT:  Head: Normocephalic and atraumatic.  Eyes: EOM are normal.  Neck: Normal range of motion.  Cardiovascular: Normal rate and regular rhythm.   Pulmonary/Chest: Effort normal and breath sounds normal. No respiratory distress. She has no wheezes. She has no rales.  Abdominal: Soft. Bowel sounds are normal.  Musculoskeletal: She exhibits no edema.  Neurological: She is alert and oriented to person, place, and time. Coordination normal.  Skin: Skin is warm and dry.   Filed Vitals:   02/05/16 1432  BP: 120/78  Pulse: 65  Temp: 98.3 F (36.8 C)  TempSrc: Oral  Resp: 12  Height: 5\' 3"  (1.6 m)  Weight: 123 lb (55.792 kg)  SpO2: 98%      Assessment & Plan:

## 2016-03-28 ENCOUNTER — Other Ambulatory Visit: Payer: Self-pay | Admitting: Internal Medicine

## 2016-03-28 DIAGNOSIS — Z1231 Encounter for screening mammogram for malignant neoplasm of breast: Secondary | ICD-10-CM

## 2016-04-04 ENCOUNTER — Ambulatory Visit
Admission: RE | Admit: 2016-04-04 | Discharge: 2016-04-04 | Disposition: A | Discharge status: Home / Self Care | Payer: BLUE CROSS/BLUE SHIELD | Source: Ambulatory Visit | Attending: Internal Medicine | Admitting: Internal Medicine

## 2016-04-04 DIAGNOSIS — Z1231 Encounter for screening mammogram for malignant neoplasm of breast: Secondary | ICD-10-CM

## 2017-04-08 ENCOUNTER — Ambulatory Visit (INDEPENDENT_AMBULATORY_CARE_PROVIDER_SITE_OTHER): Payer: 59 | Admitting: Internal Medicine

## 2017-04-08 ENCOUNTER — Encounter: Payer: Self-pay | Admitting: Internal Medicine

## 2017-04-08 ENCOUNTER — Other Ambulatory Visit (INDEPENDENT_AMBULATORY_CARE_PROVIDER_SITE_OTHER): Payer: 59

## 2017-04-08 VITALS — BP 98/60 | HR 61 | Temp 98.3°F | Resp 12 | Ht 63.0 in | Wt 126.0 lb

## 2017-04-08 DIAGNOSIS — Z0000 Encounter for general adult medical examination without abnormal findings: Secondary | ICD-10-CM | POA: Diagnosis not present

## 2017-04-08 LAB — COMPREHENSIVE METABOLIC PANEL
ALK PHOS: 47 U/L (ref 39–117)
ALT: 18 U/L (ref 0–35)
AST: 19 U/L (ref 0–37)
Albumin: 4.4 g/dL (ref 3.5–5.2)
BILIRUBIN TOTAL: 0.6 mg/dL (ref 0.2–1.2)
BUN: 16 mg/dL (ref 6–23)
CALCIUM: 9.8 mg/dL (ref 8.4–10.5)
CO2: 33 mEq/L — ABNORMAL HIGH (ref 19–32)
CREATININE: 0.68 mg/dL (ref 0.40–1.20)
Chloride: 103 mEq/L (ref 96–112)
GFR: 95.64 mL/min (ref >60.00)
Glucose, Bld: 90 mg/dL (ref 70–99)
Potassium: 4.9 mEq/L (ref 3.5–5.1)
Sodium: 140 mEq/L (ref 135–145)
Total Protein: 7.1 g/dL (ref 6.0–8.3)

## 2017-04-08 LAB — LIPID PANEL
CHOL/HDL RATIO: 4
Cholesterol: 188 mg/dL (ref 0–200)
HDL: 51.5 mg/dL (ref >39.00)
LDL CALC: 121 mg/dL — AB (ref 0–99)
NonHDL: 136.08
TRIGLYCERIDES: 74 mg/dL (ref 0.0–149.0)
VLDL: 14.8 mg/dL (ref 0.0–40.0)

## 2017-04-08 LAB — VITAMIN B12: VITAMIN B 12: 281 pg/mL (ref 211–911)

## 2017-04-08 LAB — TSH: TSH: 2.07 u[IU]/mL (ref 0.35–4.50)

## 2017-04-08 LAB — CBC
HCT: 40.5 % (ref 36.0–46.0)
Hemoglobin: 13.7 g/dL (ref 12.0–15.0)
MCHC: 33.8 g/dL (ref 30.0–36.0)
MCV: 87.6 fl (ref 78.0–100.0)
PLATELETS: 206 10*3/uL (ref 150.0–400.0)
RBC: 4.63 Mil/uL (ref 3.87–5.11)
RDW: 13.4 % (ref 11.5–15.5)
WBC: 4.2 10*3/uL (ref 4.0–10.5)

## 2017-04-08 LAB — VITAMIN D 25 HYDROXY (VIT D DEFICIENCY, FRACTURES): VITD: 32.75 ng/mL (ref 30.00–100.00)

## 2017-04-08 MED ORDER — IMIQUIMOD 5 % EX CREA: TOPICAL_CREAM | CUTANEOUS | 0 refills | Status: DC

## 2017-04-08 NOTE — Progress Notes (Signed)
Pre visit review using our clinic review tool, if applicable. No additional management support is needed unless otherwise documented below in the visit note. 

## 2017-04-08 NOTE — Progress Notes (Signed)
   Subjective:    Patient ID: Sheila Cox, female    DOB: March 04, 1961, 56 y.o.   MRN: [id number]  HPI The patient is a 56 YO female coming in for wellness. No new concerns.  PMH, [facility], social history reviewed and updated.   Review of Systems  Constitutional: Negative.   HENT: Negative.   Eyes: Negative.   Respiratory: Negative for cough, chest tightness and shortness of breath.   Cardiovascular: Negative for chest pain, palpitations and leg swelling.  Gastrointestinal: Negative for abdominal distention, abdominal pain, constipation, diarrhea, nausea and vomiting.  Musculoskeletal: Negative.   Skin: Negative.   Neurological: Negative.   Psychiatric/Behavioral: Negative.       Objective:   Physical Exam  Constitutional: She is oriented to person, place, and time. She appears well-developed and well-nourished.  HENT:  Head: Normocephalic and atraumatic.  Eyes: EOM are normal.  Neck: Normal range of motion.  Cardiovascular: Normal rate and regular rhythm.   Pulmonary/Chest: Effort normal and breath sounds normal. No respiratory distress. She has no wheezes. She has no rales.  Abdominal: Soft. Bowel sounds are normal. She exhibits no distension. There is no tenderness. There is no rebound.  Musculoskeletal: She exhibits no edema.  Neurological: She is alert and oriented to person, place, and time. Coordination normal.  Skin: Skin is warm and dry.  Psychiatric: She has a normal mood and affect.   Vitals:   04/08/17 0803  BP: 98/60  Pulse: 61  Resp: 12  Temp: 98.3 F (36.8 C)  TempSrc: Oral  SpO2: 100%  Weight: 126 lb (57.2 kg)  Height: 5\' 3"  (1.6 m)      Assessment & Plan:

## 2017-04-08 NOTE — Patient Instructions (Signed)
We have sent in the medicine for the warts to the pharmacy.   You should think about getting the shingles shot which is 2 shots to protect against the shingles.   Health Maintenance, Female Adopting a healthy lifestyle and getting preventive care can go a long way to promote health and wellness. Talk with your health care provider about what schedule of regular examinations is right for you. This is a good chance for you to check in with your provider about disease prevention and staying healthy. In between checkups, there are plenty of things you can do on your own. Experts have done a lot of research about which lifestyle changes and preventive measures are most likely to keep you healthy. Ask your health care provider for more information. Weight and diet Eat a healthy diet  Be sure to include plenty of vegetables, fruits, low-fat dairy products, and lean protein.  Do not eat a lot of foods high in solid fats, added sugars, or salt.  Get regular exercise. This is one of the most important things you can do for your health.  Most adults should exercise for at least 150 minutes each week. The exercise should increase your heart rate and make you sweat (moderate-intensity exercise).  Most adults should also do strengthening exercises at least twice a week. This is in addition to the moderate-intensity exercise. Maintain a healthy weight  Body mass index (BMI) is a measurement that can be used to identify possible weight problems. It estimates body fat based on height and weight. Your health care provider can help determine your BMI and help you achieve or maintain a healthy weight.  For females 24 years of age and older:  A BMI below 18.5 is considered underweight.  A BMI of 18.5 to 24.9 is normal.  A BMI of 25 to 29.9 is considered overweight.  A BMI of 30 and above is considered obese. Watch levels of cholesterol and blood lipids  You should start having your blood tested for  lipids and cholesterol at 55 years of age, then have this test every 5 years.  You may need to have your cholesterol levels checked more often if:  Your lipid or cholesterol levels are high.  You are older than 55 years of age.  You are at high risk for heart disease. Cancer screening Lung Cancer  Lung cancer screening is recommended for adults 36-19 years old who are at high risk for lung cancer because of a history of smoking.  A yearly low-dose CT scan of the lungs is recommended for people who:  Currently smoke.  Have quit within the past 15 years.  Have at least a 30-pack-year history of smoking. A pack year is smoking an average of one pack of cigarettes a day for 1 year.  Yearly screening should continue until it has been 15 years since you quit.  Yearly screening should stop if you develop a health problem that would prevent you from having lung cancer treatment. Breast Cancer  Practice breast self-awareness. This means understanding how your breasts normally appear and feel.  It also means doing regular breast self-exams. Let your health care provider know about any changes, no matter how small.  If you are in your 20s or 30s, you should have a clinical breast exam (CBE) by a health care provider every 1-3 years as part of a regular health exam.  If you are 9 or older, have a CBE every year. Also consider having a breast X-ray (  mammogram) every year.  If you have a family history of breast cancer, talk to your health care provider about genetic screening.  If you are at high risk for breast cancer, talk to your health care provider about having an MRI and a mammogram every year.  Breast cancer gene (BRCA) assessment is recommended for women who have family members with BRCA-related cancers. BRCA-related cancers include:  Breast.  Ovarian.  Tubal.  Peritoneal cancers.  Results of the assessment will determine the need for genetic counseling and BRCA1 and  BRCA2 testing. Cervical Cancer  Your health care provider may recommend that you be screened regularly for cancer of the pelvic organs (ovaries, uterus, and vagina). This screening involves a pelvic examination, including checking for microscopic changes to the surface of your cervix (Pap test). You may be encouraged to have this screening done every 3 years, beginning at age 70.  For women ages 76-65, health care providers may recommend pelvic exams and Pap testing every 3 years, or they may recommend the Pap and pelvic exam, combined with testing for human papilloma virus (HPV), every 5 years. Some types of HPV increase your risk of cervical cancer. Testing for HPV may also be done on women of any age with unclear Pap test results.  Other health care providers may not recommend any screening for nonpregnant women who are considered low risk for pelvic cancer and who do not have symptoms. Ask your health care provider if a screening pelvic exam is right for you.  If you have had past treatment for cervical cancer or a condition that could lead to cancer, you need Pap tests and screening for cancer for at least 20 years after your treatment. If Pap tests have been discontinued, your risk factors (such as having a new sexual partner) need to be reassessed to determine if screening should resume. Some women have medical problems that increase the chance of getting cervical cancer. In these cases, your health care provider may recommend more frequent screening and Pap tests. Colorectal Cancer  This type of cancer can be detected and often prevented.  Routine colorectal cancer screening usually begins at 55 years of age and continues through 55 years of age.  Your health care provider may recommend screening at an earlier age if you have risk factors for colon cancer.  Your health care provider may also recommend using home test kits to check for hidden blood in the stool.  A small camera at the end  of a tube can be used to examine your colon directly (sigmoidoscopy or colonoscopy). This is done to check for the earliest forms of colorectal cancer.  Routine screening usually begins at age 14.  Direct examination of the colon should be repeated every 5-10 years through 55 years of age. However, you may need to be screened more often if early forms of precancerous polyps or small growths are found. Skin Cancer  Check your skin from head to toe regularly.  Tell your health care provider about any new moles or changes in moles, especially if there is a change in a mole's shape or color.  Also tell your health care provider if you have a mole that is larger than the size of a pencil eraser.  Always use sunscreen. Apply sunscreen liberally and repeatedly throughout the day.  Protect yourself by wearing long sleeves, pants, a wide-brimmed hat, and sunglasses whenever you are outside. Heart disease, diabetes, and high blood pressure  High blood pressure causes heart disease  and increases the risk of stroke. High blood pressure is more likely to develop in:  People who have blood pressure in the high end of the normal range (130-139/85-89 mm Hg).  People who are overweight or obese.  People who are African American.  If you are 67-1 years of age, have your blood pressure checked every 3-5 years. If you are 65 years of age or older, have your blood pressure checked every year. You should have your blood pressure measured twice-once when you are at a hospital or clinic, and once when you are not at a hospital or clinic. Record the average of the two measurements. To check your blood pressure when you are not at a hospital or clinic, you can use:  An automated blood pressure machine at a pharmacy.  A home blood pressure monitor.  If you are between 61 years and 72 years old, ask your health care provider if you should take aspirin to prevent strokes.  Have regular diabetes screenings.  This involves taking a blood sample to check your fasting blood sugar level.  If you are at a normal weight and have a low risk for diabetes, have this test once every three years after 55 years of age.  If you are overweight and have a high risk for diabetes, consider being tested at a younger age or more often. Preventing infection Hepatitis B  If you have a higher risk for hepatitis B, you should be screened for this virus. You are considered at high risk for hepatitis B if:  You were born in a country where hepatitis B is common. Ask your health care provider which countries are considered high risk.  Your parents were born in a high-risk country, and you have not been immunized against hepatitis B (hepatitis B vaccine).  You have HIV or AIDS.  You use needles to inject street drugs.  You live with someone who has hepatitis B.  You have had sex with someone who has hepatitis B.  You get hemodialysis treatment.  You take certain medicines for conditions, including cancer, organ transplantation, and autoimmune conditions. Hepatitis C  Blood testing is recommended for:  Everyone born from [birth year] through 1964.  Anyone with known risk factors for hepatitis C. Sexually transmitted infections (STIs)  You should be screened for sexually transmitted infections (STIs) including gonorrhea and chlamydia if:  You are sexually active and are younger than 55 years of age.  You are older than 55 years of age and your health care provider tells you that you are at risk for this type of infection.  Your sexual activity has changed since you were last screened and you are at an increased risk for chlamydia or gonorrhea. Ask your health care provider if you are at risk.  If you do not have HIV, but are at risk, it may be recommended that you take a prescription medicine daily to prevent HIV infection. This is called pre-exposure prophylaxis (PrEP). You are considered at risk if:  You are  sexually active and do not regularly use condoms or know the HIV status of your partner(s).  You take drugs by injection.  You are sexually active with a partner who has HIV. Talk with your health care provider about whether you are at high risk of being infected with HIV. If you choose to begin PrEP, you should first be tested for HIV. You should then be tested every 3 months for as long as you are taking PrEP. Pregnancy  If you are premenopausal and you may become pregnant, ask your health care provider about preconception counseling.  If you may become pregnant, take 400 to 800 micrograms (mcg) of folic acid every day.  If you want to prevent pregnancy, talk to your health care provider about birth control (contraception). Osteoporosis and menopause  Osteoporosis is a disease in which the bones lose minerals and strength with aging. This can result in serious bone fractures. Your risk for osteoporosis can be identified using a bone density scan.  If you are 65 years of age or older, or if you are at risk for osteoporosis and fractures, ask your health care provider if you should be screened.  Ask your health care provider whether you should take a calcium or vitamin D supplement to lower your risk for osteoporosis.  Menopause may have certain physical symptoms and risks.  Hormone replacement therapy may reduce some of these symptoms and risks. Talk to your health care provider about whether hormone replacement therapy is right for you. Follow these instructions at home:  Schedule regular health, dental, and eye exams.  Stay current with your immunizations.  Do not use any tobacco products including cigarettes, chewing tobacco, or electronic cigarettes.  If you are pregnant, do not drink alcohol.  If you are breastfeeding, limit how much and how often you drink alcohol.  Limit alcohol intake to no more than 1 drink per day for nonpregnant women. One drink equals 12 ounces of  beer, 5 ounces of wine, or 1 ounces of hard liquor.  Do not use street drugs.  Do not share needles.  Ask your health care provider for help if you need support or information about quitting drugs.  Tell your health care provider if you often feel depressed.  Tell your health care provider if you have ever been abused or do not feel safe at home. This information is not intended to replace advice given to you by your health care provider. Make sure you discuss any questions you have with your health care provider. Document Released: 07/01/2011 Document Revised: 05/23/2016 Document Reviewed: 09/19/2015 Elsevier Interactive Patient Education  2017 [employer]  

## 2017-04-08 NOTE — Assessment & Plan Note (Signed)
Checking labs, mammogram up to date. Declines tetanus and shingrix today. Educated about new shingles shot. Counseled about sun safety and mole surveillance and dangers of distracted driving. Given screening recommendations.

## 2017-05-09 ENCOUNTER — Encounter: Payer: Self-pay | Admitting: Internal Medicine

## 2017-05-09 ENCOUNTER — Ambulatory Visit (INDEPENDENT_AMBULATORY_CARE_PROVIDER_SITE_OTHER): Payer: 59 | Admitting: Internal Medicine

## 2017-05-09 DIAGNOSIS — H00014 Hordeolum externum left upper eyelid: Secondary | ICD-10-CM | POA: Diagnosis not present

## 2017-05-09 DIAGNOSIS — H00019 Hordeolum externum unspecified eye, unspecified eyelid: Secondary | ICD-10-CM | POA: Insufficient documentation

## 2017-05-09 NOTE — Assessment & Plan Note (Signed)
No indication for antibiotics and no signs of cellulitis. She will use warm compress. She is advised not to use medication which is not prescribed for her as it could have side effects or she could be allergic to it.

## 2017-05-09 NOTE — Progress Notes (Signed)
   Subjective:    Patient ID: Sheila Cox, female    DOB: [birth year], 56 y.o.   MRN: [id number]  HPI The patient is a 55 YO female coming in for stye in her left eye. She denies pain in the eye. No discharge. Started about 1 week ago. Overall is slightly better than at onset. No swelling or redness around the eye. She used an eye drop her husband had which she is not sure what it is and she thinks it helped some. Denies vision changes or double/blurred vision.   Review of Systems  Constitutional: Negative for activity change, appetite change, fatigue, fever and unexpected weight change.  Eyes: Positive for pain. Negative for photophobia, discharge, redness, itching and visual disturbance.  Respiratory: Negative.   Cardiovascular: Negative.   Gastrointestinal: Negative.   Musculoskeletal: Negative.   Neurological: Negative.       Objective:   Physical Exam  Constitutional: She appears well-developed and well-nourished.  HENT:  Head: Normocephalic and atraumatic.  Right Ear: External ear normal.  Left Ear: External ear normal.  Eyes: EOM are normal. Pupils are equal, round, and reactive to light.  Small stye on the left upper eyelid (underneath) without drainage, cellulitis on the eyelid. No surrounding edema or abnormality.   Neck: Normal range of motion.  Cardiovascular: Normal rate and regular rhythm.   Pulmonary/Chest: Effort normal and breath sounds normal.  Abdominal: Soft.   Vitals:   05/09/17 1558  BP: (!) 100/52  Pulse: 60  Resp: 12  Temp: 98.6 F (37 C)  TempSrc: Oral  SpO2: 99%  Weight: 125 lb (56.7 kg)  Height: 5\' 3"  (1.6 m)      Assessment & Plan:

## 2017-05-09 NOTE — Patient Instructions (Signed)
Use the warm compress on the area and this should help it clear.

## 2018-04-21 ENCOUNTER — Telehealth: Payer: Self-pay | Admitting: Internal Medicine

## 2018-04-21 NOTE — Telephone Encounter (Signed)
Last Cologurad: 10/23/2014 LOV: 05/09/2017 Next appt: Not Sch'ed Would you like to reorder cologuard?

## 2018-04-21 NOTE — Telephone Encounter (Signed)
Appointment scheduled.

## 2018-04-21 NOTE — Telephone Encounter (Signed)
Pt is due for appt. Can we contact regarding same?

## 2018-05-07 ENCOUNTER — Other Ambulatory Visit (INDEPENDENT_AMBULATORY_CARE_PROVIDER_SITE_OTHER): Payer: 59

## 2018-05-07 ENCOUNTER — Ambulatory Visit (INDEPENDENT_AMBULATORY_CARE_PROVIDER_SITE_OTHER): Payer: 59 | Admitting: Internal Medicine

## 2018-05-07 ENCOUNTER — Encounter: Payer: Self-pay | Admitting: Internal Medicine

## 2018-05-07 VITALS — BP 100/68 | HR 58 | Temp 98.5°F | Ht 63.0 in | Wt 130.0 lb

## 2018-05-07 DIAGNOSIS — Z0000 Encounter for general adult medical examination without abnormal findings: Secondary | ICD-10-CM | POA: Diagnosis not present

## 2018-05-07 LAB — COMPREHENSIVE METABOLIC PANEL
ALT: 18 U/L (ref 0–35)
AST: 17 U/L (ref 0–37)
Albumin: 4.3 g/dL (ref 3.5–5.2)
Alkaline Phosphatase: 48 U/L (ref 39–117)
BUN: 11 mg/dL (ref 6–23)
CO2: 31 meq/L (ref 19–32)
Calcium: 9.7 mg/dL (ref 8.4–10.5)
Chloride: 103 mEq/L (ref 96–112)
Creatinine, Ser: 0.7 mg/dL (ref 0.40–1.20)
GFR: 92.13 mL/min (ref >60.00)
GLUCOSE: 88 mg/dL (ref 70–99)
POTASSIUM: 4.1 meq/L (ref 3.5–5.1)
Sodium: 140 mEq/L (ref 135–145)
Total Bilirubin: 1 mg/dL (ref 0.2–1.2)
Total Protein: 6.9 g/dL (ref 6.0–8.3)

## 2018-05-07 LAB — CBC
HCT: 37.1 % (ref 36.0–46.0)
HEMOGLOBIN: 12.7 g/dL (ref 12.0–15.0)
MCHC: 34.4 g/dL (ref 30.0–36.0)
MCV: 86.5 fl (ref 78.0–100.0)
PLATELETS: 190 10*3/uL (ref 150.0–400.0)
RBC: 4.28 Mil/uL (ref 3.87–5.11)
RDW: 13.2 % (ref 11.5–15.5)
WBC: 4 10*3/uL (ref 4.0–10.5)

## 2018-05-07 LAB — LIPID PANEL
CHOL/HDL RATIO: 3
Cholesterol: 170 mg/dL (ref 0–200)
HDL: 54.4 mg/dL (ref >39.00)
LDL CALC: 102 mg/dL — AB (ref 0–99)
NONHDL: 115.77
Triglycerides: 69 mg/dL (ref 0.0–149.0)
VLDL: 13.8 mg/dL (ref 0.0–40.0)

## 2018-05-07 LAB — T4, FREE: Free T4: 0.84 ng/dL (ref 0.60–1.60)

## 2018-05-07 LAB — VITAMIN D 25 HYDROXY (VIT D DEFICIENCY, FRACTURES): VITD: 30.43 ng/mL (ref 30.00–100.00)

## 2018-05-07 LAB — VITAMIN B12: Vitamin B-12: 245 pg/mL (ref 211–911)

## 2018-05-07 LAB — TSH: TSH: 2.34 u[IU]/mL (ref 0.35–4.50)

## 2018-05-07 MED ORDER — VALACYCLOVIR HCL 1 G PO TABS: 1000.0000 mg | ORAL_TABLET | Freq: Two times a day (BID) | ORAL | 11 refills | Status: DC

## 2018-05-07 NOTE — Patient Instructions (Addendum)
We will check the labs today. We have ordered cologuard so check on coverage and do that.   The mammogram is ordered so call [phone] to schedule.    Back Exercises The following exercises strengthen the muscles that help to support the back. They also help to keep the lower back flexible. Doing these exercises can help to prevent back pain or lessen existing pain. If you have back pain or discomfort, try doing these exercises 2-3 times each day or as told by your health care provider. When the pain goes away, do them once each day, but increase the number of times that you repeat the steps for each exercise (do more repetitions). If you do not have back pain or discomfort, do these exercises once each day or as told by your health care provider. Exercises Single Knee to Chest  Repeat these steps 3-5 times for each leg: 1. Lie on your back on a firm bed or the floor with your legs extended. 2. Bring one knee to your chest. Your other leg should stay extended and in contact with the floor. 3. Hold your knee in place by grabbing your knee or thigh. 4. Pull on your knee until you feel a gentle stretch in your lower back. 5. Hold the stretch for 10-30 seconds. 6. Slowly release and straighten your leg.  Pelvic Tilt  Repeat these steps 5-10 times: 1. Lie on your back on a firm bed or the floor with your legs extended. 2. Bend your knees so they are pointing toward the ceiling and your feet are flat on the floor. 3. Tighten your lower abdominal muscles to press your lower back against the floor. This motion will tilt your pelvis so your tailbone points up toward the ceiling instead of pointing to your feet or the floor. 4. With gentle tension and even breathing, hold this position for 5-10 seconds.  Cat-Cow  Repeat these steps until your lower back becomes more flexible: 1. Get into a hands-and-knees position on a firm surface. Keep your hands under your shoulders, and keep your knees  under your hips. You may place padding under your knees for comfort. 2. Let your head hang down, and point your tailbone toward the floor so your lower back becomes rounded like the back of a cat. 3. Hold this position for 5 seconds. 4. Slowly lift your head and point your tailbone up toward the ceiling so your back forms a sagging arch like the back of a cow. 5. Hold this position for 5 seconds.  Press-Ups  Repeat these steps 5-10 times: 1. Lie on your abdomen (face-down) on the floor. 2. Place your palms near your head, about shoulder-width apart. 3. While you keep your back as relaxed as possible and keep your hips on the floor, slowly straighten your arms to raise the top half of your body and lift your shoulders. Do not use your back muscles to raise your upper torso. You may adjust the placement of your hands to make yourself more comfortable. 4. Hold this position for 5 seconds while you keep your back relaxed. 5. Slowly return to lying flat on the floor.  Bridges  Repeat these steps 10 times: 1. Lie on your back on a firm surface. 2. Bend your knees so they are pointing toward the ceiling and your feet are flat on the floor. 3. Tighten your buttocks muscles and lift your buttocks off of the floor until your waist is at almost the same height as  your knees. You should feel the muscles working in your buttocks and the back of your thighs. If you do not feel these muscles, slide your feet 1-2 inches farther away from your buttocks. 4. Hold this position for 3-5 seconds. 5. Slowly lower your hips to the starting position, and allow your buttocks muscles to relax completely.  If this exercise is too easy, try doing it with your arms crossed over your chest. Abdominal Crunches  Repeat these steps 5-10 times: 1. Lie on your back on a firm bed or the floor with your legs extended. 2. Bend your knees so they are pointing toward the ceiling and your feet are flat on the floor. 3. Cross  your arms over your chest. 4. Tip your chin slightly toward your chest without bending your neck. 5. Tighten your abdominal muscles and slowly raise your trunk (torso) high enough to lift your shoulder blades a tiny bit off of the floor. Avoid raising your torso higher than that, because it can put too much stress on your low back and it does not help to strengthen your abdominal muscles. 6. Slowly return to your starting position.  Back Lifts Repeat these steps 5-10 times: 1. Lie on your abdomen (face-down) with your arms at your sides, and rest your forehead on the floor. 2. Tighten the muscles in your legs and your buttocks. 3. Slowly lift your chest off of the floor while you keep your hips pressed to the floor. Keep the back of your head in line with the curve in your back. Your eyes should be looking at the floor. 4. Hold this position for 3-5 seconds. 5. Slowly return to your starting position.  Contact a health care provider if:  Your back pain or discomfort gets much worse when you do an exercise.  Your back pain or discomfort does not lessen within 2 hours after you exercise. If you have any of these problems, stop doing these exercises right away. Do not do them again unless your health care provider says that you can. Get help right away if:  You develop sudden, severe back pain. If this happens, stop doing the exercises right away. Do not do them again unless your health care provider says that you can. This information is not intended to replace advice given to you by your health care provider. Make sure you discuss any questions you have with your health care provider. Document Released: 01/23/2005 Document Revised: 04/24/2016 Document Reviewed: 02/09/2015 Elsevier Interactive Patient Education  2017 [place] Maintenance, Female Adopting a healthy lifestyle and getting preventive care can go a long way to promote health and wellness. Talk with your health  care provider about what schedule of regular examinations is right for you. This is a good chance for you to check in with your provider about disease prevention and staying healthy. In between checkups, there are plenty of things you can do on your own. Experts have done a lot of research about which lifestyle changes and preventive measures are most likely to keep you healthy. Ask your health care provider for more information. Weight and diet Eat a healthy diet  Be sure to include plenty of vegetables, fruits, low-fat dairy products, and lean protein.  Do not eat a lot of foods high in solid fats, added sugars, or salt.  Get regular exercise. This is one of the most important things you can do for your health. ? Most adults should exercise for at least 150  minutes each week. The exercise should increase your heart rate and make you sweat (moderate-intensity exercise). ? Most adults should also do strengthening exercises at least twice a week. This is in addition to the moderate-intensity exercise.  Maintain a healthy weight  Body mass index (BMI) is a measurement that can be used to identify possible weight problems. It estimates body fat based on height and weight. Your health care provider can help determine your BMI and help you achieve or maintain a healthy weight.  For females 38 years of age and older: ? A BMI below 18.5 is considered underweight. ? A BMI of 18.5 to 24.9 is normal. ? A BMI of 25 to 29.9 is considered overweight. ? A BMI of 30 and above is considered obese.  Watch levels of cholesterol and blood lipids  You should start having your blood tested for lipids and cholesterol at 56 years of age, then have this test every 5 years.  You may need to have your cholesterol levels checked more often if: ? Your lipid or cholesterol levels are high. ? You are older than 56 years of age. ? You are at high risk for heart disease.  Cancer screening Lung Cancer  Lung cancer  screening is recommended for adults 68-37 years old who are at high risk for lung cancer because of a history of smoking.  A yearly low-dose CT scan of the lungs is recommended for people who: ? Currently smoke. ? Have quit within the past 15 years. ? Have at least a 30-pack-year history of smoking. A pack year is smoking an average of one pack of cigarettes a day for 1 year.  Yearly screening should continue until it has been 15 years since you quit.  Yearly screening should stop if you develop a health problem that would prevent you from having lung cancer treatment.  Breast Cancer  Practice breast self-awareness. This means understanding how your breasts normally appear and feel.  It also means doing regular breast self-exams. Let your health care provider know about any changes, no matter how small.  If you are in your 20s or 30s, you should have a clinical breast exam (CBE) by a health care provider every 1-3 years as part of a regular health exam.  If you are 36 or older, have a CBE every year. Also consider having a breast X-ray (mammogram) every year.  If you have a family history of breast cancer, talk to your health care provider about genetic screening.  If you are at high risk for breast cancer, talk to your health care provider about having an MRI and a mammogram every year.  Breast cancer gene (BRCA) assessment is recommended for women who have family members with BRCA-related cancers. BRCA-related cancers include: ? Breast. ? Ovarian. ? Tubal. ? Peritoneal cancers.  Results of the assessment will determine the need for genetic counseling and BRCA1 and BRCA2 testing.  Cervical Cancer Your health care provider may recommend that you be screened regularly for cancer of the pelvic organs (ovaries, uterus, and vagina). This screening involves a pelvic examination, including checking for microscopic changes to the surface of your cervix (Pap test). You may be encouraged to  have this screening done every 3 years, beginning at age 19.  For women ages 49-65, health care providers may recommend pelvic exams and Pap testing every 3 years, or they may recommend the Pap and pelvic exam, combined with testing for human papilloma virus (HPV), every 5 years. Some types  of HPV increase your risk of cervical cancer. Testing for HPV may also be done on women of any age with unclear Pap test results.  Other health care providers may not recommend any screening for nonpregnant women who are considered low risk for pelvic cancer and who do not have symptoms. Ask your health care provider if a screening pelvic exam is right for you.  If you have had past treatment for cervical cancer or a condition that could lead to cancer, you need Pap tests and screening for cancer for at least 20 years after your treatment. If Pap tests have been discontinued, your risk factors (such as having a new sexual partner) need to be reassessed to determine if screening should resume. Some women have medical problems that increase the chance of getting cervical cancer. In these cases, your health care provider may recommend more frequent screening and Pap tests.  Colorectal Cancer  This type of cancer can be detected and often prevented.  Routine colorectal cancer screening usually begins at 56 years of age and continues through 56 years of age.  Your health care provider may recommend screening at an earlier age if you have risk factors for colon cancer.  Your health care provider may also recommend using home test kits to check for hidden blood in the stool.  A small camera at the end of a tube can be used to examine your colon directly (sigmoidoscopy or colonoscopy). This is done to check for the earliest forms of colorectal cancer.  Routine screening usually begins at age 52.  Direct examination of the colon should be repeated every 5-10 years through 56 years of age. However, you may need to be  screened more often if early forms of precancerous polyps or small growths are found.  Skin Cancer  Check your skin from head to toe regularly.  Tell your health care provider about any new moles or changes in moles, especially if there is a change in a mole's shape or color.  Also tell your health care provider if you have a mole that is larger than the size of a pencil eraser.  Always use sunscreen. Apply sunscreen liberally and repeatedly throughout the day.  Protect yourself by wearing long sleeves, pants, a wide-brimmed hat, and sunglasses whenever you are outside.  Heart disease, diabetes, and high blood pressure  High blood pressure causes heart disease and increases the risk of stroke. High blood pressure is more likely to develop in: ? People who have blood pressure in the high end of the normal range (130-139/85-89 mm Hg). ? People who are overweight or obese. ? People who are African American.  If you are 24-34 years of age, have your blood pressure checked every 3-5 years. If you are 59 years of age or older, have your blood pressure checked every year. You should have your blood pressure measured twice-once when you are at a hospital or clinic, and once when you are not at a hospital or clinic. Record the average of the two measurements. To check your blood pressure when you are not at a hospital or clinic, you can use: ? An automated blood pressure machine at a pharmacy. ? A home blood pressure monitor.  If you are between 1 years and 43 years old, ask your health care provider if you should take aspirin to prevent strokes.  Have regular diabetes screenings. This involves taking a blood sample to check your fasting blood sugar level. ? If you are at  a normal weight and have a low risk for diabetes, have this test once every three years after 55 years of age. ? If you are overweight and have a high risk for diabetes, consider being tested at a younger age or more  often. Preventing infection Hepatitis B  If you have a higher risk for hepatitis B, you should be screened for this virus. You are considered at high risk for hepatitis B if: ? You were born in a country where hepatitis B is common. Ask your health care provider which countries are considered high risk. ? Your parents were born in a high-risk country, and you have not been immunized against hepatitis B (hepatitis B vaccine). ? You have HIV or AIDS. ? You use needles to inject street drugs. ? You live with someone who has hepatitis B. ? You have had sex with someone who has hepatitis B. ? You get hemodialysis treatment. ? You take certain medicines for conditions, including cancer, organ transplantation, and autoimmune conditions.  Hepatitis C  Blood testing is recommended for: ? Everyone born from [birth year] through 1964. ? Anyone with known risk factors for hepatitis C.  Sexually transmitted infections (STIs)  You should be screened for sexually transmitted infections (STIs) including gonorrhea and chlamydia if: ? You are sexually active and are younger than 56 years of age. ? You are older than 56 years of age and your health care provider tells you that you are at risk for this type of infection. ? Your sexual activity has changed since you were last screened and you are at an increased risk for chlamydia or gonorrhea. Ask your health care provider if you are at risk.  If you do not have HIV, but are at risk, it may be recommended that you take a prescription medicine daily to prevent HIV infection. This is called pre-exposure prophylaxis (PrEP). You are considered at risk if: ? You are sexually active and do not regularly use condoms or know the HIV status of your partner(s). ? You take drugs by injection. ? You are sexually active with a partner who has HIV.  Talk with your health care provider about whether you are at high risk of being infected with HIV. If you choose to begin PrEP,  you should first be tested for HIV. You should then be tested every 3 months for as long as you are taking PrEP. Pregnancy  If you are premenopausal and you may become pregnant, ask your health care provider about preconception counseling.  If you may become pregnant, take 400 to 800 micrograms (mcg) of folic acid every day.  If you want to prevent pregnancy, talk to your health care provider about birth control (contraception). Osteoporosis and menopause  Osteoporosis is a disease in which the bones lose minerals and strength with aging. This can result in serious bone fractures. Your risk for osteoporosis can be identified using a bone density scan.  If you are 79 years of age or older, or if you are at risk for osteoporosis and fractures, ask your health care provider if you should be screened.  Ask your health care provider whether you should take a calcium or vitamin D supplement to lower your risk for osteoporosis.  Menopause may have certain physical symptoms and risks.  Hormone replacement therapy may reduce some of these symptoms and risks. Talk to your health care provider about whether hormone replacement therapy is right for you. Follow these instructions at home:  Schedule regular health, dental,  and eye exams.  Stay current with your immunizations.  Do not use any tobacco products including cigarettes, chewing tobacco, or electronic cigarettes.  If you are pregnant, do not drink alcohol.  If you are breastfeeding, limit how much and how often you drink alcohol.  Limit alcohol intake to no more than 1 drink per day for nonpregnant women. One drink equals 12 ounces of beer, 5 ounces of wine, or 1 ounces of hard liquor.  Do not use street drugs.  Do not share needles.  Ask your health care provider for help if you need support or information about quitting drugs.  Tell your health care provider if you often feel depressed.  Tell your health care provider if you  have ever been abused or do not feel safe at home. This information is not intended to replace advice given to you by your health care provider. Make sure you discuss any questions you have with your health care provider. Document Released: 07/01/2011 Document Revised: 05/23/2016 Document Reviewed: 09/19/2015 Elsevier Interactive Patient Education  [employer].

## 2018-05-07 NOTE — Progress Notes (Signed)
   Subjective:    Patient ID: Sheila Cox, female    DOB: 1961-06-06, 57 y.o.   MRN: [id number]  HPI The patient is a 57 YO female coming in for physical. No new complaints.   PMH, [facility], social history reviewed and updated  Review of Systems  Constitutional: Negative.   HENT: Negative.   Eyes: Negative.   Respiratory: Negative for cough, chest tightness and shortness of breath.   Cardiovascular: Negative for chest pain, palpitations and leg swelling.  Gastrointestinal: Negative for abdominal distention, abdominal pain, constipation, diarrhea, nausea and vomiting.  Musculoskeletal: Negative.   Skin: Negative.   Neurological: Negative.   Psychiatric/Behavioral: Negative.       Objective:   Physical Exam  Constitutional: She is oriented to person, place, and time. She appears well-developed and well-nourished.  HENT:  Head: Normocephalic and atraumatic.  Eyes: EOM are normal.  Neck: Normal range of motion.  Cardiovascular: Normal rate and regular rhythm.  Pulmonary/Chest: Effort normal and breath sounds normal. No respiratory distress. She has no wheezes. She has no rales.  Abdominal: Soft. Bowel sounds are normal. She exhibits no distension. There is no tenderness. There is no rebound.  Musculoskeletal: She exhibits no edema.  Neurological: She is alert and oriented to person, place, and time. Coordination normal.  Skin: Skin is warm and dry.  Psychiatric: She has a normal mood and affect.   Vitals:   05/07/18 0807  BP: 100/68  Pulse: (!) 58  Temp: 98.5 F (36.9 C)  TempSrc: Oral  SpO2: 97%  Weight: 130 lb (59 kg)  Height: 5\' 3"  (1.6 m)      Assessment & Plan:

## 2018-05-07 NOTE — Assessment & Plan Note (Signed)
Checking labs today, reminded to return to gyn for pap smear. Mammogram and cologuard ordered. Reminded about yearly flu shot. Declines tetanus today. Added to shingrix waiting list. Counseled about sun safety and dangers of distracted driving. Given screening recommendations.

## 2018-06-19 ENCOUNTER — Ambulatory Visit
Admission: RE | Admit: 2018-06-19 | Discharge: 2018-06-19 | Disposition: A | Discharge status: Home / Self Care | Payer: 59 | Source: Ambulatory Visit | Attending: Internal Medicine | Admitting: Internal Medicine

## 2018-06-19 ENCOUNTER — Telehealth: Payer: Self-pay

## 2018-06-19 DIAGNOSIS — Z0000 Encounter for general adult medical examination without abnormal findings: Secondary | ICD-10-CM

## 2018-06-19 DIAGNOSIS — Z1231 Encounter for screening mammogram for malignant neoplasm of breast: Secondary | ICD-10-CM | POA: Diagnosis not present

## 2018-06-19 NOTE — Telephone Encounter (Signed)
Copied from [place]. Topic: Quick Communication - Office Called Patient >> Jun 19, 2018 12:18 PM Sheila Cox wrote: Reason for CRM: Office called patient to schedule shingles injection, do not see record. Ok to have prior to vacation?  Left message advising patient that it's prob best plan of care to have her remain in town at least 1 week before leaving to go on vacation after getting first injection in case of sensitivity reaction---if not leaving for vacation until much later than that, ok to schedule nurse visit to get first injection, will need to return in 2 to 4 months to 2nd injection---can talk with Christion Leonhard,RN at [place] office if any further questions

## 2018-07-24 ENCOUNTER — Ambulatory Visit: Payer: 59

## 2018-12-04 DIAGNOSIS — L308 Other specified dermatitis: Secondary | ICD-10-CM | POA: Diagnosis not present

## 2019-12-27 ENCOUNTER — Other Ambulatory Visit: Payer: Self-pay

## 2019-12-28 ENCOUNTER — Other Ambulatory Visit (HOSPITAL_COMMUNITY)
Admission: RE | Admit: 2019-12-28 | Discharge: 2019-12-28 | Disposition: A | Discharge status: Home / Self Care | Payer: Managed Care, Other (non HMO) | Source: Ambulatory Visit | Attending: Obstetrics & Gynecology | Admitting: Obstetrics & Gynecology

## 2019-12-28 ENCOUNTER — Other Ambulatory Visit: Payer: Self-pay

## 2019-12-28 ENCOUNTER — Encounter: Payer: Self-pay | Admitting: Certified Nurse Midwife

## 2019-12-28 ENCOUNTER — Ambulatory Visit (INDEPENDENT_AMBULATORY_CARE_PROVIDER_SITE_OTHER): Payer: Managed Care, Other (non HMO) | Admitting: Certified Nurse Midwife

## 2019-12-28 ENCOUNTER — Other Ambulatory Visit: Payer: Self-pay | Admitting: Certified Nurse Midwife

## 2019-12-28 VITALS — BP 104/64 | HR 68 | Temp 97.3°F | Resp 16 | Ht 63.0 in | Wt 129.0 lb

## 2019-12-28 DIAGNOSIS — Z23 Encounter for immunization: Secondary | ICD-10-CM | POA: Diagnosis not present

## 2019-12-28 DIAGNOSIS — E559 Vitamin D deficiency, unspecified: Secondary | ICD-10-CM | POA: Diagnosis not present

## 2019-12-28 DIAGNOSIS — N951 Menopausal and female climacteric states: Secondary | ICD-10-CM | POA: Diagnosis not present

## 2019-12-28 DIAGNOSIS — Z8619 Personal history of other infectious and parasitic diseases: Secondary | ICD-10-CM

## 2019-12-28 DIAGNOSIS — Z0000 Encounter for general adult medical examination without abnormal findings: Secondary | ICD-10-CM | POA: Insufficient documentation

## 2019-12-28 DIAGNOSIS — Z01419 Encounter for gynecological examination (general) (routine) without abnormal findings: Secondary | ICD-10-CM

## 2019-12-28 MED ORDER — VALACYCLOVIR HCL 1 G PO TABS: ORAL_TABLET | ORAL | 1 refills | Status: DC

## 2019-12-28 NOTE — Addendum Note (Signed)
Addended by: Regina Eck on: 12/28/2019 10:56 AM   Modules accepted: Level of Service

## 2019-12-28 NOTE — Patient Instructions (Signed)
EXERCISE AND DIET:  We recommended that you start or continue a regular exercise program for good health. Regular exercise means any activity that makes your heart beat faster and makes you sweat.  We recommend exercising at least 30 minutes per day at least 3 days a week, preferably 4 or 5.  We also recommend a diet low in fat and sugar.  Inactivity, poor dietary choices and obesity can cause diabetes, heart attack, stroke, and kidney damage, among others.   ° °ALCOHOL AND SMOKING:  Women should limit their alcohol intake to no more than 7 drinks/beers/glasses of wine (combined, not each!) per week. Moderation of alcohol intake to this level decreases your risk of breast cancer and liver damage. And of course, no recreational drugs are part of a healthy lifestyle.  And absolutely no smoking or even second hand smoke. Most people know smoking can cause heart and lung diseases, but did you know it also contributes to weakening of your bones? Aging of your skin?  Yellowing of your teeth and nails? ° °CALCIUM AND VITAMIN D:  Adequate intake of calcium and Vitamin D are recommended.  The recommendations for exact amounts of these supplements seem to change often, but generally speaking 600 mg of calcium (either carbonate or citrate) and 800 units of Vitamin D per day seems prudent. Certain women may benefit from higher intake of Vitamin D.  If you are among these women, your doctor will have told you during your visit.   ° °PAP SMEARS:  Pap smears, to check for cervical cancer or precancers,  have traditionally been done yearly, although recent scientific advances have shown that most women can have pap smears less often.  However, every woman still should have a physical exam from her gynecologist every year. It will include a breast check, inspection of the vulva and vagina to check for abnormal growths or skin changes, a visual exam of the cervix, and then an exam to evaluate the size and shape of the uterus and  ovaries.  And after 57 years of age, a rectal exam is indicated to check for rectal cancers. We will also provide age appropriate advice regarding health maintenance, like when you should have certain vaccines, screening for sexually transmitted diseases, bone density testing, colonoscopy, mammograms, etc.  ° °MAMMOGRAMS:  All women over 40 years old should have a yearly mammogram. Many facilities now offer a "3D" mammogram, which may cost around $50 extra out of pocket. If possible,  we recommend you accept the option to have the 3D mammogram performed.  It both reduces the number of women who will be called back for extra views which then turn out to be normal, and it is better than the routine mammogram at detecting truly abnormal areas.   ° °COLONOSCOPY:  Colonoscopy to screen for colon cancer is recommended for all women at age 50.  We know, you hate the idea of the prep.  We agree, BUT, having colon cancer and not knowing it is worse!!  Colon cancer so often starts as a polyp that can be seen and removed at colonscopy, which can quite literally save your life!  And if your first colonoscopy is normal and you have no family history of colon cancer, most women don't have to have it again for 10 years.  Once every ten years, you can do something that may end up saving your life, right?  We will be happy to help you get it scheduled when you are ready.    Be sure to check your insurance coverage so you understand how much it will cost.  It may be covered as a preventative service at no cost, but you should check your particular policy.   ° ° ° °Atrophic Vaginitis °Atrophic vaginitis is a condition in which the tissues that line the vagina become dry and thin. This condition occurs in women who have stopped having their period. It is caused by a drop in a female hormone (estrogen). This hormone helps: °· To keep the vagina moist. °· To make a clear fluid. This clear fluid helps: °? To make the vagina ready for  sex. °? To protect the vagina from infection. °If the lining of the vagina is dry and thin, it may cause irritation, burning, or itchiness. It may also: °· Make sex painful. °· Make an exam of your vagina painful. °· Cause bleeding. °· Make you lose interest in sex. °· Cause a burning feeling when you pee (urinate). °· Cause a brown or yellow fluid to come from your vagina. °Some women do not have symptoms. °Follow these instructions at home: °Medicines °· Take over-the-counter and prescription medicines only as told by your doctor. °· Do not use herbs or other medicines unless your doctor says it is okay. °· Use medicines for for dryness. These include: °? Oils to make the vagina soft. °? Creams. °? Moisturizers. °General instructions °· Do not douche. °· Do not use products that can make your vagina dry. These include: °? Scented sprays. °? Scented tampons. °? Scented soaps. °· Sex can help increase blood flow and soften the tissue in the vagina. If it hurts to have sex: °? Tell your partner. °? Use products to make sex more comfortable. Use these only as told by your doctor. °Contact a doctor if you: °· Have discharge from the vagina that is different than usual. °· Have a bad smell coming from your vagina. °· Have new symptoms. °· Do not get better. °· Get worse. °Summary °· Atrophic vaginitis is a condition in which the lining of the vagina becomes dry and thin. °· This condition affects women who have stopped having their periods. °· Treatment may include using products that help make the vagina soft. °· Call a doctor if do not get better with treatment. °This information is not intended to replace advice given to you by your health care provider. Make sure you discuss any questions you have with your health care provider. °Document Released: 06/03/2008 Document Revised: 12/29/2017 Document Reviewed: 12/29/2017 °Elsevier Patient Education © 2020 [employer] ° °

## 2019-12-28 NOTE — Progress Notes (Signed)
58 y.o. G51P2002 Married  Caucasian Fe here to establish gyn care and for annual exam. Last aex 2014. Periods stopped 3-4 years ago. Menopausal symptoms of hot flashes/night sweats have subsided.Denies vaginal bleeding since stopped. Some vaginal dryness with intercourse, no vaginal bleeding or cracked skin. Sees Dr. Sharlet Salina PCP yearly with labs, have not be seen this year. 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 Has used Valtrex before and needs update. No other health issues today. Screening labs today.   Patient's last menstrual period was 03/10/2013.          Sexually active: Yes.    The current method of family planning is post menopausal status.    Exercising: Yes.    walking Smoker:  no  Review of Systems  Constitutional: Negative.   HENT: Negative.   Eyes: Negative.   Respiratory: Negative.   Cardiovascular: Negative.   Gastrointestinal: Negative.   Genitourinary:       Vaginal dryness & pain with intercourse  Musculoskeletal: Negative.   Skin: Negative.   Neurological: Negative.   Endo/Heme/Allergies: Negative.   Psychiatric/Behavioral: Negative.     Health Maintenance: Pap:  04-20-13 neg History of Abnormal Pap: no MMG:  06-19-18 category c density birads 1:neg Self Breast exams: yes Colonoscopy:cologard neg 2014, BMD:   none TDaP:  Unsure requests today Shingles: not done Pneumonia: not done Hep C and HIV: hep c neg 2017, HIV neg 1995 Labs: if needed   reports that she has never smoked. She has never used smokeless tobacco. She reports current alcohol use of about 3.0 - 4.0 standard drinks of alcohol per week. She reports that she does not use drugs.  Past Medical History:  Diagnosis Date  . Depression   . Dyspareunia    occ  . Heart murmur   . Migraines   . STD (sexually transmitted disease)    HSV 2    Past Surgical History:  Procedure Laterality Date  . HERNIA REPAIR      Current Outpatient Medications  Medication Sig Dispense Refill   . Ibuprofen (ADVIL PO) Take by mouth as needed.     No current facility-administered medications for this visit.    Family History  Problem Relation Age of Onset  . Hypertension Mother   . Peripheral vascular disease Mother   . Skin cancer Mother   . Glaucoma Mother   . Hypertension Father   . Lung cancer Father   . Thyroid disease Sister   . Skin cancer Sister   . Cancer Maternal Grandmother 62       colon  . Thyroid disease Maternal Grandmother        thyroid removed  . Stroke Maternal Grandfather 62    ROS:  Pertinent items are noted in HPI.  Otherwise, a comprehensive ROS was negative.  Exam:   BP 104/64   Pulse 68   Temp (!) 97.3 F (36.3 C) (Skin)   Resp 16   Ht 5\' 3"  (1.6 m)   Wt 129 lb (58.5 kg)   LMP 03/10/2013   BMI 22.85 kg/m  Height: 5\' 3"  (160 cm) Ht Readings from Last 3 Encounters:  12/28/19 5\' 3"  (1.6 m)  05/07/18 5\' 3"  (1.6 m)  05/09/17 5\' 3"  (1.6 m)    General appearance: alert, cooperative and appears stated age Head: Normocephalic, without obvious abnormality, atraumatic Neck: no adenopathy, supple, symmetrical, trachea midline and thyroid normal to inspection and palpation Lungs: clear to auscultation bilaterally Breasts: normal appearance, no  masses or tenderness, No nipple retraction or dimpling, No nipple discharge or bleeding, No axillary or supraclavicular adenopathy Heart: regular rate and rhythm Abdomen: soft, non-tender; no masses,  no organomegaly Extremities: extremities normal, atraumatic, no cyanosis or edema Skin: Skin color, texture, turgor normal. No rashes or lesions Lymph nodes: Cervical, supraclavicular, and axillary nodes normal. No abnormal inguinal nodes palpated Neurologic: Grossly normal   Pelvic: External genitalia:  no lesions              Urethra:  normal appearing urethra with no masses, tenderness or lesions              Bartholin's and Skene's: normal                 Vagina: normal appearing vagina with  normal color and discharge, no lesions, dryness only noted at introitus, no atrophy              Cervix: multiparous appearance, no cervical motion tenderness and no lesions              Pap taken: Yes.   Bimanual Exam:  Uterus:  normal size, contour, position, consistency, mobility, non-tender and anteverted              Adnexa: normal adnexa and no mass, fullness, tenderness               Rectovaginal: Confirms               Anus:  normal sphincter tone, no lesions  Chaperone present: yes  A:  Well Woman with normal exam   Menopausal no HRT  Vaginal dryness introital area only  History of HSV 2 needs Rx update  Mammogram due  Screening labs  Immunization due TDAP requests today  P:   Reviewed health and wellness pertinent to exam  Aware of need to advise if vaginal bleeding  Discussed dryness introital area only. Discussed using coconut or Olive oil use prior to sexual activity and daily for best results.  Rx Valtrex see order with  Instructions  Will schedule  Labs: Vitamin D, TSH, Lipid panel, CMP, CBC  Pap smear: yes  counseled on breast self exam, mammography screening, feminine hygiene, adequate intake of calcium and vitamin D, diet and exercise, Kegel's exercises return annually or prn  An After Visit Summary was printed and given to the patient.

## 2019-12-29 ENCOUNTER — Encounter: Payer: Managed Care, Other (non HMO) | Admitting: Certified Nurse Midwife

## 2019-12-29 LAB — CBC
Hematocrit: 40.7 % (ref 34.0–46.6)
Hemoglobin: 13.4 g/dL (ref 11.1–15.9)
MCH: 29.4 pg (ref 26.6–33.0)
MCHC: 32.9 g/dL (ref 31.5–35.7)
MCV: 89 fL (ref 79–97)
Platelets: 222 10*3/uL (ref 150–450)
RBC: 4.56 x10E6/uL (ref 3.77–5.28)
RDW: 13.1 % (ref 11.7–15.4)
WBC: 4.4 10*3/uL (ref 3.4–10.8)

## 2019-12-29 LAB — COMPREHENSIVE METABOLIC PANEL
ALT: 18 IU/L (ref 0–32)
AST: 19 IU/L (ref 0–40)
Albumin/Globulin Ratio: 2 (ref 1.2–2.2)
Albumin: 4.5 g/dL (ref 3.8–4.9)
Alkaline Phosphatase: 72 IU/L (ref 39–117)
BUN/Creatinine Ratio: 21 (ref 9–23)
BUN: 12 mg/dL (ref 6–24)
Bilirubin Total: 0.5 mg/dL (ref 0.0–1.2)
CO2: 29 mmol/L (ref 20–29)
Calcium: 9.6 mg/dL (ref 8.7–10.2)
Chloride: 103 mmol/L (ref 96–106)
Creatinine, Ser: 0.58 mg/dL (ref 0.57–1.00)
GFR calc Af Amer: 118 mL/min/{1.73_m2} (ref >59)
GFR calc non Af Amer: 103 mL/min/{1.73_m2} (ref >59)
Globulin, Total: 2.3 g/dL (ref 1.5–4.5)
Glucose: 90 mg/dL (ref 65–99)
Potassium: 4.2 mmol/L (ref 3.5–5.2)
Sodium: 142 mmol/L (ref 134–144)
Total Protein: 6.8 g/dL (ref 6.0–8.5)

## 2019-12-29 LAB — LIPID PANEL
Chol/HDL Ratio: 2.9 ratio (ref 0.0–4.4)
Cholesterol, Total: 193 mg/dL (ref 100–199)
HDL: 66 mg/dL (ref >39)
LDL Chol Calc (NIH): 111 mg/dL — ABNORMAL HIGH (ref 0–99)
Triglycerides: 90 mg/dL (ref 0–149)
VLDL Cholesterol Cal: 16 mg/dL (ref 5–40)

## 2019-12-29 LAB — CYTOLOGY - PAP
Comment: NEGATIVE
Diagnosis: NEGATIVE
High risk HPV: NEGATIVE

## 2019-12-29 LAB — TSH: TSH: 2.59 u[IU]/mL (ref 0.450–4.500)

## 2019-12-29 LAB — VITAMIN D 25 HYDROXY (VIT D DEFICIENCY, FRACTURES): Vit D, 25-Hydroxy: 28.7 ng/mL — ABNORMAL LOW (ref 30.0–100.0)

## 2019-12-31 DIAGNOSIS — N189 Chronic kidney disease, unspecified: Secondary | ICD-10-CM

## 2019-12-31 HISTORY — DX: Chronic kidney disease, unspecified: N18.9

## 2020-03-06 ENCOUNTER — Emergency Department (HOSPITAL_COMMUNITY)
Admission: EM | Admit: 2020-03-06 | Discharge: 2020-03-06 | Disposition: A | Discharge status: Home / Self Care | Payer: Managed Care, Other (non HMO) | Attending: Emergency Medicine | Admitting: Emergency Medicine

## 2020-03-06 ENCOUNTER — Emergency Department (HOSPITAL_COMMUNITY): Payer: Managed Care, Other (non HMO)

## 2020-03-06 ENCOUNTER — Other Ambulatory Visit: Payer: Self-pay

## 2020-03-06 ENCOUNTER — Encounter (HOSPITAL_COMMUNITY): Payer: Self-pay

## 2020-03-06 DIAGNOSIS — N201 Calculus of ureter: Secondary | ICD-10-CM | POA: Diagnosis not present

## 2020-03-06 DIAGNOSIS — N23 Unspecified renal colic: Secondary | ICD-10-CM | POA: Diagnosis not present

## 2020-03-06 DIAGNOSIS — Z79899 Other long term (current) drug therapy: Secondary | ICD-10-CM | POA: Diagnosis not present

## 2020-03-06 DIAGNOSIS — R109 Unspecified abdominal pain: Secondary | ICD-10-CM | POA: Diagnosis present

## 2020-03-06 LAB — BASIC METABOLIC PANEL
Anion gap: 8 (ref 5–15)
BUN: 17 mg/dL (ref 6–20)
CO2: 29 mmol/L (ref 22–32)
Calcium: 9.7 mg/dL (ref 8.9–10.3)
Chloride: 105 mmol/L (ref 98–111)
Creatinine, Ser: 0.81 mg/dL (ref 0.44–1.00)
GFR calc Af Amer: >60 mL/min (ref >60)
GFR calc non Af Amer: >60 mL/min (ref >60)
Glucose, Bld: 133 mg/dL — ABNORMAL HIGH (ref 70–99)
Potassium: 3.9 mmol/L (ref 3.5–5.1)
Sodium: 142 mmol/L (ref 135–145)

## 2020-03-06 LAB — URINALYSIS, ROUTINE W REFLEX MICROSCOPIC
Bilirubin Urine: NEGATIVE
Glucose, UA: NEGATIVE mg/dL
Hgb urine dipstick: NEGATIVE
Ketones, ur: NEGATIVE mg/dL
Nitrite: NEGATIVE
Protein, ur: NEGATIVE mg/dL
Specific Gravity, Urine: 1.018 (ref 1.005–1.030)
pH: 5 (ref 5.0–8.0)

## 2020-03-06 LAB — CBC WITH DIFFERENTIAL/PLATELET
Abs Immature Granulocytes: 0.02 10*3/uL (ref 0.00–0.07)
Basophils Absolute: 0.1 10*3/uL (ref 0.0–0.1)
Basophils Relative: 1 %
Eosinophils Absolute: 0.1 10*3/uL (ref 0.0–0.5)
Eosinophils Relative: 1 %
HCT: 40.8 % (ref 36.0–46.0)
Hemoglobin: 13.4 g/dL (ref 12.0–15.0)
Immature Granulocytes: 0 %
Lymphocytes Relative: 16 %
Lymphs Abs: 1.5 10*3/uL (ref 0.7–4.0)
MCH: 29.6 pg (ref 26.0–34.0)
MCHC: 32.8 g/dL (ref 30.0–36.0)
MCV: 90.1 fL (ref 80.0–100.0)
Monocytes Absolute: 0.6 10*3/uL (ref 0.1–1.0)
Monocytes Relative: 7 %
Neutro Abs: 7.3 10*3/uL (ref 1.7–7.7)
Neutrophils Relative %: 75 %
Platelets: 230 10*3/uL (ref 150–400)
RBC: 4.53 MIL/uL (ref 3.87–5.11)
RDW: 12.5 % (ref 11.5–15.5)
WBC: 9.5 10*3/uL (ref 4.0–10.5)
nRBC: 0 % (ref 0.0–0.2)

## 2020-03-06 MED ORDER — OXYCODONE-ACETAMINOPHEN 5-325 MG PO TABS: 1.0000 | ORAL_TABLET | ORAL | 0 refills | Status: DC | PRN

## 2020-03-06 MED ORDER — TAMSULOSIN HCL 0.4 MG PO CAPS: 0.4000 mg | ORAL_CAPSULE | Freq: Every day | ORAL | 0 refills | Status: DC

## 2020-03-06 MED ORDER — KETOROLAC TROMETHAMINE 30 MG/ML IJ SOLN
30.0000 mg | Freq: Once | INTRAMUSCULAR | Status: AC
  Administered 2020-03-06: 30 mg via INTRAVENOUS
  Filled 2020-03-06: qty 1

## 2020-03-06 MED ORDER — ONDANSETRON HCL 4 MG/2ML IJ SOLN
4.0000 mg | Freq: Once | INTRAMUSCULAR | Status: AC
  Administered 2020-03-06: 4 mg via INTRAVENOUS
  Filled 2020-03-06: qty 2

## 2020-03-06 MED ORDER — HYDROMORPHONE HCL 1 MG/ML IJ SOLN
1.0000 mg | Freq: Once | INTRAMUSCULAR | Status: AC
  Administered 2020-03-06: 1 mg via INTRAVENOUS
  Filled 2020-03-06: qty 1

## 2020-03-06 MED ORDER — ONDANSETRON HCL 4 MG PO TABS: 4.0000 mg | ORAL_TABLET | Freq: Four times a day (QID) | ORAL | 0 refills | Status: DC | PRN

## 2020-03-06 NOTE — ED Triage Notes (Signed)
Patient arrived with complaints of lower left sided back pain that started suddenly tonight. Denies any numbness, tingling, urinary problems, or any other symptoms, pain does not radiate.

## 2020-03-06 NOTE — ED Provider Notes (Signed)
[facility] DEPT Provider Note   CSN: [id number] Arrival date & time: 03/06/20  0304     History Chief Complaint  Patient presents with  . Back Pain    Sheila Cox is a 59 y.o. female.  Patient presents to the emergency department for evaluation of left flank pain.  Patient reports the pain suddenly began tonight while in bed.  She woke up and felt like she had to urinate and then had severe stabbing pains in the left flank.  Patient has not had any injury.  Pain does not radiate.        Past Medical History:  Diagnosis Date  . Depression   . Dyspareunia    occ  . Heart murmur   . Migraines   . STD (sexually transmitted disease)    HSV 2    Patient Active Problem List   Diagnosis Date Noted  . Herpes simplex 10/21/2014  . Routine general medical examination at a health care facility 10/21/2014    Past Surgical History:  Procedure Laterality Date  . HERNIA REPAIR       OB History    Gravida  2   Para  2   Term  2   Preterm      AB      Living  2     SAB      TAB      Ectopic      Multiple      Live Births  2           Family History  Problem Relation Age of Onset  . Hypertension Mother   . Peripheral vascular disease Mother   . Skin cancer Mother   . Glaucoma Mother   . Hypertension Father   . Lung cancer Father   . Thyroid disease Sister   . Skin cancer Sister   . Cancer Maternal Grandmother 55       colon  . Thyroid disease Maternal Grandmother        thyroid removed  . Stroke Maternal Grandfather 64    Social History   Tobacco Use  . Smoking status: Never Smoker  . Smokeless tobacco: Never Used  Substance Use Topics  . Alcohol use: Yes    Alcohol/week: 3.0 - 4.0 standard drinks    Types: 3 - 4 Glasses of wine per week  . Drug use: No    Home Medications Prior to Admission medications   Medication Sig Start Date End Date Taking? Authorizing Provider  Ibuprofen (ADVIL PO) Take  by mouth as needed.    [provider]  ondansetron (ZOFRAN) 4 MG tablet Take 1 tablet (4 mg total) by mouth every 6 (six) hours as needed for nausea or vomiting. 03/06/20   Orpah Greek, MD  oxyCODONE-acetaminophen (PERCOCET) 5-325 MG tablet Take 1-2 tablets by mouth every 4 (four) hours as needed. 03/06/20   Orpah Greek, MD  tamsulosin (FLOMAX) 0.4 MG CAPS capsule Take 1 capsule (0.4 mg total) by mouth daily. 03/06/20   Orpah Greek, MD  valACYclovir (VALTREX) 1000 MG tablet One tablet 2 times daily x 3 days at onset of outbreak 12/28/19   Regina Eck, CNM    Allergies    Patient has no known allergies.  Review of Systems   Review of Systems  Genitourinary: Positive for flank pain.  All other systems reviewed and are negative.   Physical Exam Updated Vital Signs BP 136/80   Pulse  68   Temp 97.6 F (36.4 C) (Oral)   Resp 18   LMP 03/10/2013   SpO2 100%   Physical Exam Vitals and nursing note reviewed.  Constitutional:      General: She is in acute distress.     Appearance: Normal appearance. She is well-developed.  HENT:     Head: Normocephalic and atraumatic.     Right Ear: Hearing normal.     Left Ear: Hearing normal.     Nose: Nose normal.  Eyes:     Conjunctiva/sclera: Conjunctivae normal.     Pupils: Pupils are equal, round, and reactive to light.  Cardiovascular:     Rate and Rhythm: Regular rhythm.     Heart sounds: S1 normal and S2 normal. No murmur. No friction rub. No gallop.   Pulmonary:     Effort: Pulmonary effort is normal. No respiratory distress.     Breath sounds: Normal breath sounds.  Chest:     Chest wall: No tenderness.  Abdominal:     General: Bowel sounds are normal.     Palpations: Abdomen is soft.     Tenderness: There is no abdominal tenderness. There is left CVA tenderness. There is no guarding or rebound. Negative signs include Murphy's sign and McBurney's sign.     Hernia: No hernia is present.    Musculoskeletal:        General: Normal range of motion.     Cervical back: Normal range of motion and neck supple.  Skin:    General: Skin is warm and dry.     Findings: No rash.  Neurological:     Mental Status: She is alert and oriented to person, place, and time.     GCS: GCS eye subscore is 4. GCS verbal subscore is 5. GCS motor subscore is 6.     Cranial Nerves: No cranial nerve deficit.     Sensory: No sensory deficit.     Coordination: Coordination normal.  Psychiatric:        Speech: Speech normal.        Behavior: Behavior normal.        Thought Content: Thought content normal.     ED Results / Procedures / Treatments   Labs (all labs ordered are listed, but only abnormal results are displayed) Labs Reviewed  BASIC METABOLIC PANEL - Abnormal; Notable for the following components:      Result Value   Glucose, Bld 133 (*)    All other components within normal limits  URINALYSIS, ROUTINE W REFLEX MICROSCOPIC - Abnormal; Notable for the following components:   Leukocytes,Ua SMALL (*)    Bacteria, UA RARE (*)    All other components within normal limits  CBC WITH DIFFERENTIAL/PLATELET    EKG None  Radiology CT RENAL STONE STUDY  Result Date: 03/06/2020 CLINICAL DATA:  Flank pain with kidney stone suspected. EXAM: CT ABDOMEN AND PELVIS WITHOUT CONTRAST TECHNIQUE: Multidetector CT imaging of the abdomen and pelvis was performed following the standard protocol without IV contrast. COMPARISON:  None. FINDINGS: Lower chest:  No contributory findings. Hepatobiliary: No focal liver abnormality.No evidence of biliary obstruction or stone. Pancreas: Unremarkable. Spleen: Unremarkable. Adrenals/Urinary Tract: Negative adrenals. 4 mm stone at the UVJ with hydroureteronephrosis and periureteric stranding. No additional urolithiasis. Unremarkable bladder. Stomach/Bowel:  No obstruction. No appendicitis. Vascular/Lymphatic: No acute vascular abnormality. No mass or adenopathy.  Reproductive:No pathologic findings. Other: No ascites or pneumoperitoneum. Musculoskeletal: No acute abnormalities. L5-S1 focal disc narrowing and degenerative facet spurring. L3  hemangioma. IMPRESSION: Obstructing 4 mm left UVJ calculus. Electronically Signed   By: Monte Fantasia M.D.   On: 03/06/2020 04:14    Procedures Procedures (including critical care time)  Medications Ordered in ED Medications  ketorolac (TORADOL) 30 MG/ML injection 30 mg (has no administration in time range)  HYDROmorphone (DILAUDID) injection 1 mg (1 mg Intravenous Given 03/06/20 0436)  ondansetron (ZOFRAN) injection 4 mg (4 mg Intravenous Given 03/06/20 0436)    ED Course  I have reviewed the triage vital signs and the nursing notes.  Pertinent labs & imaging results that were available during my care of the patient were reviewed by me and considered in my medical decision making (see chart for details).    MDM Rules/Calculators/A&P                      Patient presents to the emergency department for evaluation of left flank pain.  Patient reports that she had sudden onset of pain tonight.  Patient was in moderate amount of distress at arrival.  Work-up confirms left UVJ stone.  Patient with improvement after analgesia.  Continue analgesia, follow-up with urology.  Final Clinical Impression(s) / ED Diagnoses Final diagnoses:  Renal colic on left side  Ureterolithiasis    Rx / DC Orders ED Discharge Orders         Ordered    oxyCODONE-acetaminophen (PERCOCET) 5-325 MG tablet  Every 4 hours PRN     03/06/20 0508    ondansetron (ZOFRAN) 4 MG tablet  Every 6 hours PRN     03/06/20 0508    tamsulosin (FLOMAX) 0.4 MG CAPS capsule  Daily     03/06/20 0508           Orpah Greek, MD 03/06/20 [phone]

## 2020-03-20 ENCOUNTER — Encounter: Payer: Self-pay | Admitting: Certified Nurse Midwife

## 2021-01-25 ENCOUNTER — Other Ambulatory Visit: Payer: Self-pay | Admitting: Internal Medicine

## 2021-01-25 DIAGNOSIS — Z1231 Encounter for screening mammogram for malignant neoplasm of breast: Secondary | ICD-10-CM

## 2021-03-09 ENCOUNTER — Ambulatory Visit (INDEPENDENT_AMBULATORY_CARE_PROVIDER_SITE_OTHER): Payer: Managed Care, Other (non HMO) | Admitting: Internal Medicine

## 2021-03-09 ENCOUNTER — Encounter: Payer: Self-pay | Admitting: Internal Medicine

## 2021-03-09 ENCOUNTER — Ambulatory Visit
Admission: RE | Admit: 2021-03-09 | Discharge: 2021-03-09 | Disposition: A | Discharge status: Home / Self Care | Payer: Managed Care, Other (non HMO) | Source: Ambulatory Visit | Attending: Internal Medicine | Admitting: Internal Medicine

## 2021-03-09 ENCOUNTER — Other Ambulatory Visit: Payer: Self-pay

## 2021-03-09 VITALS — BP 118/60 | HR 55 | Temp 98.2°F | Resp 18 | Ht 63.0 in | Wt 127.8 lb

## 2021-03-09 DIAGNOSIS — Z8619 Personal history of other infectious and parasitic diseases: Secondary | ICD-10-CM

## 2021-03-09 DIAGNOSIS — Z1211 Encounter for screening for malignant neoplasm of colon: Secondary | ICD-10-CM | POA: Diagnosis not present

## 2021-03-09 DIAGNOSIS — B009 Herpesviral infection, unspecified: Secondary | ICD-10-CM

## 2021-03-09 DIAGNOSIS — Z1231 Encounter for screening mammogram for malignant neoplasm of breast: Secondary | ICD-10-CM

## 2021-03-09 DIAGNOSIS — Z0000 Encounter for general adult medical examination without abnormal findings: Secondary | ICD-10-CM

## 2021-03-09 LAB — CBC
HCT: 35.9 % — ABNORMAL LOW (ref 36.0–46.0)
Hemoglobin: 12.2 g/dL (ref 12.0–15.0)
MCHC: 33.9 g/dL (ref 30.0–36.0)
MCV: 88 fl (ref 78.0–100.0)
Platelets: 221 10*3/uL (ref 150.0–400.0)
RBC: 4.08 Mil/uL (ref 3.87–5.11)
RDW: 13.8 % (ref 11.5–15.5)
WBC: 4.1 10*3/uL (ref 4.0–10.5)

## 2021-03-09 LAB — COMPREHENSIVE METABOLIC PANEL
ALT: 18 U/L (ref 0–35)
AST: 20 U/L (ref 0–37)
Albumin: 4.4 g/dL (ref 3.5–5.2)
Alkaline Phosphatase: 55 U/L (ref 39–117)
BUN: 14 mg/dL (ref 6–23)
CO2: 29 mEq/L (ref 19–32)
Calcium: 9.9 mg/dL (ref 8.4–10.5)
Chloride: 105 mEq/L (ref 96–112)
Creatinine, Ser: 0.74 mg/dL (ref 0.40–1.20)
GFR: 89.16 mL/min (ref >60.00)
Glucose, Bld: 89 mg/dL (ref 70–99)
Potassium: 4.9 mEq/L (ref 3.5–5.1)
Sodium: 142 mEq/L (ref 135–145)
Total Bilirubin: 0.6 mg/dL (ref 0.2–1.2)
Total Protein: 7.1 g/dL (ref 6.0–8.3)

## 2021-03-09 LAB — LIPID PANEL
Cholesterol: 190 mg/dL (ref 0–200)
HDL: 58.4 mg/dL (ref >39.00)
LDL Cholesterol: 112 mg/dL — ABNORMAL HIGH (ref 0–99)
NonHDL: 131.2
Total CHOL/HDL Ratio: 3
Triglycerides: 96 mg/dL (ref 0.0–149.0)
VLDL: 19.2 mg/dL (ref 0.0–40.0)

## 2021-03-09 LAB — TSH: TSH: 2.43 u[IU]/mL (ref 0.35–4.50)

## 2021-03-09 MED ORDER — VALACYCLOVIR HCL 1 G PO TABS: ORAL_TABLET | ORAL | 3 refills | Status: DC

## 2021-03-09 NOTE — Progress Notes (Signed)
   Subjective:   Patient ID: Sheila Cox, female    DOB: [birth year], 60 y.o.   MRN: [id number]  HPI The patient is a 60 YO female coming in for physical.  PMH, [place], social history reviewed and updated  Review of Systems  Constitutional: Negative.   HENT: Negative.   Eyes: Negative.   Respiratory: Negative for cough, chest tightness and shortness of breath.   Cardiovascular: Negative for chest pain, palpitations and leg swelling.  Gastrointestinal: Negative for abdominal distention, abdominal pain, constipation, diarrhea, nausea and vomiting.  Musculoskeletal: Negative.   Skin: Negative.   Neurological: Negative.   Psychiatric/Behavioral: Negative.     Objective:  Physical Exam Constitutional:      Appearance: She is well-developed.  HENT:     Head: Normocephalic and atraumatic.  Cardiovascular:     Rate and Rhythm: Normal rate and regular rhythm.  Pulmonary:     Effort: Pulmonary effort is normal. No respiratory distress.     Breath sounds: Normal breath sounds. No wheezing or rales.  Abdominal:     General: Bowel sounds are normal. There is no distension.     Palpations: Abdomen is soft.     Tenderness: There is no abdominal tenderness. There is no rebound.  Musculoskeletal:     Cervical back: Normal range of motion.  Skin:    General: Skin is warm and dry.  Neurological:     Mental Status: She is alert and oriented to person, place, and time.     Coordination: Coordination normal.     Vitals:   03/09/21 1021  BP: 118/60  Pulse: (!) 55  Resp: 18  Temp: 98.2 F (36.8 C)  TempSrc: Oral  SpO2: 99%  Weight: 127 lb 12.8 oz (58 kg)  Height: 5\' 3"  (1.6 m)   EKG: Rate 51, axis normal, interval normal, sinus brady, no st or t wave changes, no prior to compare  This visit occurred during the SARS-CoV-2 public health emergency.  Safety protocols were in place, including screening questions prior to the visit, additional usage of staff PPE, and extensive cleaning  of exam room while observing appropriate contact time as indicated for disinfecting solutions.   Assessment & Plan:

## 2021-03-09 NOTE — Assessment & Plan Note (Signed)
Refilled valtrex  

## 2021-03-09 NOTE — Patient Instructions (Addendum)
We will get you in for the colonoscopy.  Health Maintenance, Female Adopting a healthy lifestyle and getting preventive care are important in promoting health and wellness. Ask your health care provider about:  The right schedule for you to have regular tests and exams.  Things you can do on your own to prevent diseases and keep yourself healthy. What should I know about diet, weight, and exercise? Eat a healthy diet  Eat a diet that includes plenty of vegetables, fruits, low-fat dairy products, and lean protein.  Do not eat a lot of foods that are high in solid fats, added sugars, or sodium.   Maintain a healthy weight Body mass index (BMI) is used to identify weight problems. It estimates body fat based on height and weight. Your health care provider can help determine your BMI and help you achieve or maintain a healthy weight. Get regular exercise Get regular exercise. This is one of the most important things you can do for your health. Most adults should:  Exercise for at least 150 minutes each week. The exercise should increase your heart rate and make you sweat (moderate-intensity exercise).  Do strengthening exercises at least twice a week. This is in addition to the moderate-intensity exercise.  Spend less time sitting. Even light physical activity can be beneficial. Watch cholesterol and blood lipids Have your blood tested for lipids and cholesterol at 59 years of age, then have this test every 5 years. Have your cholesterol levels checked more often if:  Your lipid or cholesterol levels are high.  You are older than 59 years of age.  You are at high risk for heart disease. What should I know about cancer screening? Depending on your health history and family history, you may need to have cancer screening at various ages. This may include screening for:  Breast cancer.  Cervical cancer.  Colorectal cancer.  Skin cancer.  Lung cancer. What should I know about  heart disease, diabetes, and high blood pressure? Blood pressure and heart disease  High blood pressure causes heart disease and increases the risk of stroke. This is more likely to develop in people who have high blood pressure readings, are of African descent, or are overweight.  Have your blood pressure checked: ? Every 3-5 years if you are 66-40 years of age. ? Every year if you are 93 years old or older. Diabetes Have regular diabetes screenings. This checks your fasting blood sugar level. Have the screening done:  Once every three years after age 4 if you are at a normal weight and have a low risk for diabetes.  More often and at a younger age if you are overweight or have a high risk for diabetes. What should I know about preventing infection? Hepatitis B If you have a higher risk for hepatitis B, you should be screened for this virus. Talk with your health care provider to find out if you are at risk for hepatitis B infection. Hepatitis C Testing is recommended for:  Everyone born from [birth year] through 1964.  Anyone with known risk factors for hepatitis C. Sexually transmitted infections (STIs)  Get screened for STIs, including gonorrhea and chlamydia, if: ? You are sexually active and are younger than 59 years of age. ? You are older than 59 years of age and your health care provider tells you that you are at risk for this type of infection. ? Your sexual activity has changed since you were last screened, and you are at increased risk  for chlamydia or gonorrhea. Ask your health care provider if you are at risk.  Ask your health care provider about whether you are at high risk for HIV. Your health care provider may recommend a prescription medicine to help prevent HIV infection. If you choose to take medicine to prevent HIV, you should first get tested for HIV. You should then be tested every 3 months for as long as you are taking the medicine. Pregnancy  If you are about to  stop having your period (premenopausal) and you may become pregnant, seek counseling before you get pregnant.  Take 400 to 800 micrograms (mcg) of folic acid every day if you become pregnant.  Ask for birth control (contraception) if you want to prevent pregnancy. Osteoporosis and menopause Osteoporosis is a disease in which the bones lose minerals and strength with aging. This can result in bone fractures. If you are 10 years old or older, or if you are at risk for osteoporosis and fractures, ask your health care provider if you should:  Be screened for bone loss.  Take a calcium or vitamin D supplement to lower your risk of fractures.  Be given hormone replacement therapy (HRT) to treat symptoms of menopause. Follow these instructions at home: Lifestyle  Do not use any products that contain nicotine or tobacco, such as cigarettes, e-cigarettes, and chewing tobacco. If you need help quitting, ask your health care provider.  Do not use street drugs.  Do not share needles.  Ask your health care provider for help if you need support or information about quitting drugs. Alcohol use  Do not drink alcohol if: ? Your health care provider tells you not to drink. ? You are pregnant, may be pregnant, or are planning to become pregnant.  If you drink alcohol: ? Limit how much you use to 0-1 drink a day. ? Limit intake if you are breastfeeding.  Be aware of how much alcohol is in your drink. In the [place] one drink equals one 12 oz bottle of beer (355 mL), one 5 oz glass of wine (148 mL), or one 1 oz glass of hard liquor (44 mL). General instructions  Schedule regular health, dental, and eye exams.  Stay current with your vaccines.  Tell your health care provider if: ? You often feel depressed. ? You have ever been abused or do not feel safe at home. Summary  Adopting a healthy lifestyle and getting preventive care are important in promoting health and wellness.  Follow your  health care provider's instructions about healthy diet, exercising, and getting tested or screened for diseases.  Follow your health care provider's instructions on monitoring your cholesterol and blood pressure. This information is not intended to replace advice given to you by your health care provider. Make sure you discuss any questions you have with your health care provider. Document Revised: 12/09/2018 Document Reviewed: 12/09/2018 Elsevier Patient Education  2021 [employer].

## 2021-03-09 NOTE — Assessment & Plan Note (Signed)
Flu shot declines. Covid-19 up to date including booster. Shingrix 1st done apt for 2nd. Tetanus up to date. Colonoscopy referral to GI placed. Mammogram getting today, pap smear up to date. Counseled about sun safety and mole surveillance. Counseled about the dangers of distracted driving. Given 10 year screening recommendations.

## 2021-03-16 ENCOUNTER — Ambulatory Visit (AMBULATORY_SURGERY_CENTER): Payer: Self-pay

## 2021-03-16 ENCOUNTER — Other Ambulatory Visit: Payer: Self-pay

## 2021-03-16 VITALS — Ht 63.0 in | Wt 131.4 lb

## 2021-03-16 DIAGNOSIS — Z1211 Encounter for screening for malignant neoplasm of colon: Secondary | ICD-10-CM

## 2021-03-16 MED ORDER — PEG 3350-KCL-NA BICARB-NACL 420 G PO SOLR: 4000.0000 mL | Freq: Once | ORAL | 0 refills | Status: AC

## 2021-03-16 NOTE — Progress Notes (Signed)

## 2021-03-29 ENCOUNTER — Encounter: Payer: Self-pay | Admitting: Internal Medicine

## 2021-03-30 ENCOUNTER — Ambulatory Visit (AMBULATORY_SURGERY_CENTER): Payer: Managed Care, Other (non HMO) | Admitting: Internal Medicine

## 2021-03-30 ENCOUNTER — Encounter: Payer: Self-pay | Admitting: Internal Medicine

## 2021-03-30 ENCOUNTER — Other Ambulatory Visit: Payer: Self-pay

## 2021-03-30 VITALS — BP 119/61 | HR 62 | Temp 97.9°F | Resp 15 | Ht 63.0 in | Wt 131.0 lb

## 2021-03-30 DIAGNOSIS — Z1211 Encounter for screening for malignant neoplasm of colon: Secondary | ICD-10-CM

## 2021-03-30 DIAGNOSIS — K635 Polyp of colon: Secondary | ICD-10-CM

## 2021-03-30 DIAGNOSIS — D120 Benign neoplasm of cecum: Secondary | ICD-10-CM

## 2021-03-30 MED ORDER — SODIUM CHLORIDE 0.9 % IV SOLN: 500.0000 mL | Freq: Once | INTRAVENOUS | Status: DC

## 2021-03-30 NOTE — Progress Notes (Signed)
VS-CW  Pt's states no medical or surgical changes since previsit or office visit.  

## 2021-03-30 NOTE — Progress Notes (Signed)
A and O x3. Report to RN. Tolerated MAC anesthesia well.

## 2021-03-30 NOTE — Patient Instructions (Signed)
Handout on polyps given. ° °YOU HAD AN ENDOSCOPIC PROCEDURE TODAY AT THE [facility]:   Refer to the procedure report that was given to you for any specific questions about what was found during the examination.  If the procedure report does not answer your questions, please call your gastroenterologist to clarify.  If you requested that your care partner not be given the details of your procedure findings, then the procedure report has been included in a sealed envelope for you to review at your convenience later. ° °YOU SHOULD EXPECT: Some feelings of bloating in the abdomen. Passage of more gas than usual.  Walking can help get rid of the air that was put into your GI tract during the procedure and reduce the bloating. If you had a lower endoscopy (such as a colonoscopy or flexible sigmoidoscopy) you may notice spotting of blood in your stool or on the toilet paper. If you underwent a bowel prep for your procedure, you may not have a normal bowel movement for a few days. ° °Please Note:  You might notice some irritation and congestion in your nose or some drainage.  This is from the oxygen used during your procedure.  There is no need for concern and it should clear up in a day or so. ° °SYMPTOMS TO REPORT IMMEDIATELY: ° °Following lower endoscopy (colonoscopy or flexible sigmoidoscopy): ° Excessive amounts of blood in the stool ° Significant tenderness or worsening of abdominal pains ° Swelling of the abdomen that is new, acute ° Fever of 100°F or higher ° °For urgent or emergent issues, a gastroenterologist can be reached at any hour by calling [phone]. °Do not use MyChart messaging for urgent concerns.  ° ° °DIET:  We do recommend a small meal at first, but then you may proceed to your regular diet.  Drink plenty of fluids but you should avoid alcoholic beverages for 24 hours. ° °ACTIVITY:  You should plan to take it easy for the rest of today and you should NOT DRIVE or use heavy machinery  until tomorrow (because of the sedation medicines used during the test).   ° °FOLLOW UP: °Our staff will call the number listed on your records 48-72 hours following your procedure to check on you and address any questions or concerns that you may have regarding the information given to you following your procedure. If we do not reach you, we will leave a message.  We will attempt to reach you two times.  During this call, we will ask if you have developed any symptoms of COVID 19. If you develop any symptoms (ie: fever, flu-like symptoms, shortness of breath, cough etc.) before then, please call [phone].  If you test positive for Covid 19 in the 2 weeks post procedure, please call and report this information to us.   ° °If any biopsies were taken you will be contacted by phone or by letter within the next 1-3 weeks.  Please call us at [phone] if you have not heard about the biopsies in 3 weeks.  ° ° °SIGNATURES/CONFIDENTIALITY: °You and/or your care partner have signed paperwork which will be entered into your electronic medical record.  These signatures attest to the fact that that the information above on your After Visit Summary has been reviewed and is understood.  Full responsibility of the confidentiality of this discharge information lies with you and/or your care-partner.  °

## 2021-03-30 NOTE — Op Note (Signed)
[facility] Patient Name: Sheila Cox Procedure Date: 03/30/2021 10:12 AM MRN: [id number] Endoscopist: Jerene Bears , MD Age: 60 Referring MD:  Date of Birth: Nov 20, 1961 Gender: Female Account #: [id number] Procedure:                Colonoscopy Indications:              Screening for colorectal malignant neoplasm, This                            is the patient's first colonoscopy Medicines:                Monitored Anesthesia Care Procedure:                Pre-Anesthesia Assessment:                           - Prior to the procedure, a History and Physical                            was performed, and patient medications and                            allergies were reviewed. The patient's tolerance of                            previous anesthesia was also reviewed. The risks                            and benefits of the procedure and the sedation                            options and risks were discussed with the patient.                            All questions were answered, and informed consent                            was obtained. Prior Anticoagulants: The patient has                            taken no previous anticoagulant or antiplatelet                            agents. ASA Grade Assessment: II - A patient with                            mild systemic disease. After reviewing the risks                            and benefits, the patient was deemed in                            satisfactory condition to undergo the procedure.  After obtaining informed consent, the colonoscope                            was passed under direct vision. Throughout the                            procedure, the patient's blood pressure, pulse, and                            oxygen saturations were monitored continuously. The                            Olympus PCF-H190DL (#[id number]) Colonoscope was                            introduced through the anus  and advanced to the                            cecum, identified by appendiceal orifice and                            ileocecal valve. The colonoscopy was performed                            without difficulty. The patient tolerated the                            procedure well. The quality of the bowel                            preparation was excellent. The ileocecal valve,                            appendiceal orifice, and rectum were photographed. Scope In: 10:22:17 AM Scope Out: 10:38:31 AM Scope Withdrawal Time: 0 hours 12 minutes 59 seconds  Total Procedure Duration: 0 hours 16 minutes 14 seconds  Findings:                 The digital rectal exam was normal.                           A 5 mm polyp was found in the cecum. The polyp was                            sessile. The polyp was removed with a cold snare.                            Resection and retrieval were complete.                           The exam was otherwise without abnormality on                            direct and retroflexion views. Complications:  No immediate complications. Estimated Blood Loss:     Estimated blood loss was minimal. Impression:               - One 5 mm polyp in the cecum, removed with a cold                            snare. Resected and retrieved.                           - The examination was otherwise normal on direct                            and retroflexion views. Recommendation:           - Patient has a contact number available for                            emergencies. The signs and symptoms of potential                            delayed complications were discussed with the                            patient. Return to normal activities tomorrow.                            Written discharge instructions were provided to the                            patient.                           - Resume previous diet.                           - Continue present  medications.                           - Await pathology results.                           - Repeat colonoscopy is recommended. The                            colonoscopy date will be determined after pathology                            results from today's exam become available for                            review. Jerene Bears, MD 03/30/2021 10:40:54 AM This report has been signed electronically.

## 2021-04-03 ENCOUNTER — Telehealth: Payer: Self-pay | Admitting: *Deleted

## 2021-04-03 NOTE — Telephone Encounter (Signed)
  Follow up Call-  Call back number 03/30/2021  Post procedure Call Back phone  # [phone]  Permission to leave phone message Yes  Some recent data might be hidden     Patient questions:  Do you have a fever, pain , or abdominal swelling? No. Pain Score  0 *  Have you tolerated food without any problems? Yes.    Have you been able to return to your normal activities? Yes.    Do you have any questions about your discharge instructions: Diet   No. Medications  No. Follow up visit  No.  Do you have questions or concerns about your Care? No.  Actions: * If pain score is 4 or above: No action needed, pain <4.  1. Have you developed a fever since your procedure? no  2.   Have you had an respiratory symptoms (SOB or cough) since your procedure? no  3.   Have you tested positive for COVID 19 since your procedure no  4.   Have you had any family members/close contacts diagnosed with the COVID 19 since your procedure?  no   If yes to any of these questions please route to Joylene John, RN and Joella Prince, RN

## 2021-04-09 ENCOUNTER — Encounter: Payer: Self-pay | Admitting: Internal Medicine

## 2021-06-29 ENCOUNTER — Encounter: Payer: Self-pay | Admitting: Family Medicine

## 2021-06-29 ENCOUNTER — Other Ambulatory Visit: Payer: Self-pay

## 2021-06-29 ENCOUNTER — Telehealth (INDEPENDENT_AMBULATORY_CARE_PROVIDER_SITE_OTHER): Payer: Managed Care, Other (non HMO) | Admitting: Family Medicine

## 2021-06-29 DIAGNOSIS — U071 COVID-19: Secondary | ICD-10-CM | POA: Diagnosis not present

## 2021-06-29 MED ORDER — NIRMATRELVIR/RITONAVIR (PAXLOVID)TABLET: 3.0000 | ORAL_TABLET | Freq: Two times a day (BID) | ORAL | 0 refills | Status: AC

## 2021-06-29 NOTE — Progress Notes (Signed)
MyChart Video Visit    Virtual Visit via Video Note   This visit type was conducted due to national recommendations for restrictions regarding the COVID-19 Pandemic (e.g. social distancing) in an effort to limit this patient's exposure and mitigate transmission in our community. This patient is at least at moderate risk for complications without adequate follow up. This format is felt to be most appropriate for this patient at this time. Physical exam was limited by quality of the video and audio technology used for the visit. [name] was able to get the patient set up on a video visit.  Patient location: Home Patient and provider in visit Provider location: Office  I discussed the limitations of evaluation and management by telemedicine and the availability of in person appointments. The patient expressed understanding and agreed to proceed.  Visit Date: 06/29/2021   Today's healthcare provider: Ann Held, DO     Subjective:    Patient ID: Sheila Cox, female    DOB: 1961/03/04, 60 y.o.   MRN: [id number]  Chief Complaint  Patient presents with   Covid Positive    HPI Patient is in today for + covid yesterday--- she has had sore throat, running nose, no fever.   She is taking Emerge C and advil.  Her symptoms started Wed night with sore throat.    Past Medical History:  Diagnosis Date   Allergy    Chronic kidney disease 2021   kidney stone   Depression    Dyspareunia    occ   Glaucoma    preventative care d/t family history   Heart murmur    Migraines    STD (sexually transmitted disease)    HSV 2    Past Surgical History:  Procedure Laterality Date   HERNIA REPAIR      Family History  Problem Relation Age of Onset   Hypertension Mother    Peripheral vascular disease Mother    Skin cancer Mother    Glaucoma Mother    Hypertension Father    Lung cancer Father    Thyroid disease Sister    Skin cancer Sister    Cancer Maternal  Grandmother 17       colon   Thyroid disease Maternal Grandmother        thyroid removed   Colon polyps Maternal Grandmother    Colon cancer Maternal Grandmother        in 74's   Stroke Maternal Grandfather 62   Esophageal cancer Neg Hx    Stomach cancer Neg Hx    Rectal cancer Neg Hx     Social History   Socioeconomic History   Marital status: Married    Spouse name: Not on file   Number of children: Not on file   Years of education: Not on file   Highest education level: Not on file  Occupational History   Not on file  Tobacco Use   Smoking status: Never   Smokeless tobacco: Never  Vaping Use   Vaping Use: Never used  Substance and Sexual Activity   Alcohol use: Yes    Alcohol/week: 3.0 - 4.0 standard drinks    Types: 3 - 4 Glasses of wine per week   Drug use: No   Sexual activity: Yes    Partners: Male    Birth control/protection: Post-menopausal  Other Topics Concern   Not on file  Social History Narrative   Not on file   Social Determinants of Health  Financial Resource Strain: Not on file  Food Insecurity: Not on file  Transportation Needs: Not on file  Physical Activity: Not on file  Stress: Not on file  Social Connections: Not on file  Intimate Partner Violence: Not on file    Outpatient Medications Prior to Visit  Medication Sig Dispense Refill   Cyanocobalamin (VITAMIN B-12 PO) Take by mouth.     ibuprofen (ADVIL) 200 MG tablet Take 400 mg by mouth every 6 (six) hours as needed for moderate pain.     timolol (TIMOPTIC) 0.5 % ophthalmic solution Place 1 drop into both eyes in the morning and at bedtime. dr     valACYclovir (VALTREX) 1000 MG tablet One tablet 2 times daily x 3 days at onset of outbreak 90 tablet 3   No facility-administered medications prior to visit.    No Known Allergies  Review of Systems  Constitutional:  Negative for diaphoresis and weight loss.  HENT:  Positive for congestion and sore throat. Negative for ear discharge,  ear pain and tinnitus.   Eyes:  Negative for double vision, photophobia, pain and discharge.  Cardiovascular:  Negative for orthopnea and claudication.  Gastrointestinal:  Negative for constipation.  Genitourinary:  Negative for frequency and hematuria.  Neurological:  Negative for sensory change, speech change, focal weakness and weakness.  Psychiatric/Behavioral:  Negative for hallucinations, substance abuse and suicidal ideas. The patient is not nervous/anxious.       Objective:    Physical Exam Constitutional:      General: She is not in acute distress.    Appearance: Normal appearance.  Neurological:     General: No focal deficit present.     Mental Status: She is alert and oriented to person, place, and time. Mental status is at baseline.    LMP 03/10/2013  Wt Readings from Last 3 Encounters:  03/30/21 131 lb (59.4 kg)  03/16/21 131 lb 6.4 oz (59.6 kg)  03/09/21 127 lb 12.8 oz (58 kg)    Diabetic Foot Exam - Simple   No data filed    Lab Results  Component Value Date   WBC 4.1 03/09/2021   HGB 12.2 03/09/2021   HCT 35.9 (L) 03/09/2021   PLT 221.0 03/09/2021   GLUCOSE 89 03/09/2021   CHOL 190 03/09/2021   TRIG 96.0 03/09/2021   HDL 58.40 03/09/2021   LDLCALC 112 (H) 03/09/2021   ALT 18 03/09/2021   AST 20 03/09/2021   NA 142 03/09/2021   K 4.9 03/09/2021   CL 105 03/09/2021   CREATININE 0.74 03/09/2021   BUN 14 03/09/2021   CO2 29 03/09/2021   TSH 2.43 03/09/2021    Lab Results  Component Value Date   TSH 2.43 03/09/2021   Lab Results  Component Value Date   WBC 4.1 03/09/2021   HGB 12.2 03/09/2021   HCT 35.9 (L) 03/09/2021   MCV 88.0 03/09/2021   PLT 221.0 03/09/2021   Lab Results  Component Value Date   NA 142 03/09/2021   K 4.9 03/09/2021   CO2 29 03/09/2021   GLUCOSE 89 03/09/2021   BUN 14 03/09/2021   CREATININE 0.74 03/09/2021   BILITOT 0.6 03/09/2021   ALKPHOS 55 03/09/2021   AST 20 03/09/2021   ALT 18 03/09/2021   PROT 7.1  03/09/2021   ALBUMIN 4.4 03/09/2021   CALCIUM 9.9 03/09/2021   ANIONGAP 8 03/06/2020   GFR 89.16 03/09/2021   Lab Results  Component Value Date   CHOL 190 03/09/2021  Lab Results  Component Value Date   HDL 58.40 03/09/2021   Lab Results  Component Value Date   LDLCALC 112 (H) 03/09/2021   Lab Results  Component Value Date   TRIG 96.0 03/09/2021   Lab Results  Component Value Date   CHOLHDL 3 03/09/2021   No results found for: HGBA1C     Assessment & Plan:   Problem List Items Addressed This Visit   None Visit Diagnoses     COVID-19    -  Primary   Relevant Medications   nirmatrelvir/ritonavir EUA (PAXLOVID) TABS     Con't otc meds Call if symptoms worsen Quarantine 5 days and then she can leave the house with a mask for 5 more days   Meds ordered this encounter  Medications   nirmatrelvir/ritonavir EUA (PAXLOVID) TABS    Sig: Take 3 tablets by mouth 2 (two) times daily for 5 days. (Take nirmatrelvir 150 mg two tablets twice daily for 5 days and ritonavir 100 mg one tablet twice daily for 5 days) Patient GFR is 89    Dispense:  30 tablet    Refill:  0    I discussed the assessment and treatment plan with the patient. The patient was provided an opportunity to ask questions and all were answered. The patient agreed with the plan and demonstrated an understanding of the instructions.   The patient was advised to call back or seek an in-person evaluation if the symptoms worsen or if the condition fails to improve as anticipated   Ann Held, [place] [facility] [employer] [phone] (phone) [phone] (fax)  [place]

## 2022-12-19 ENCOUNTER — Other Ambulatory Visit: Payer: Self-pay

## 2022-12-19 MED ORDER — INFLUENZA VAC SPLIT QUAD 0.5 ML IM SUSY
0.5000 mL | PREFILLED_SYRINGE | Freq: Once | INTRAMUSCULAR | 0 refills | Status: AC
  Filled 2022-12-19: qty 0.5, 1d supply, fill #0

## 2022-12-19 MED ORDER — COVID-19 MRNA VAC-TRIS(PFIZER) 30 MCG/0.3ML IM SUSY
0.3000 mL | PREFILLED_SYRINGE | Freq: Once | INTRAMUSCULAR | 0 refills | Status: AC
  Filled 2022-12-19: qty 0.3, 1d supply, fill #0

## 2023-02-13 IMAGING — MG MM DIGITAL SCREENING BILAT W/ TOMO AND CAD
6 of 12 series · 6 of 36 positions shown · non-contrast
Comparison: Previous exam(s).

CLINICAL DATA: Screening.

EXAM:
DIGITAL SCREENING BILATERAL MAMMOGRAM WITH TOMOSYNTHESIS AND CAD
TECHNIQUE: Bilateral screening digital craniocaudal and mediolateral oblique
mammograms were obtained. Bilateral screening digital breast
tomosynthesis was performed. The images were evaluated with
computer-aided detection.

[L MLO synth-2D]
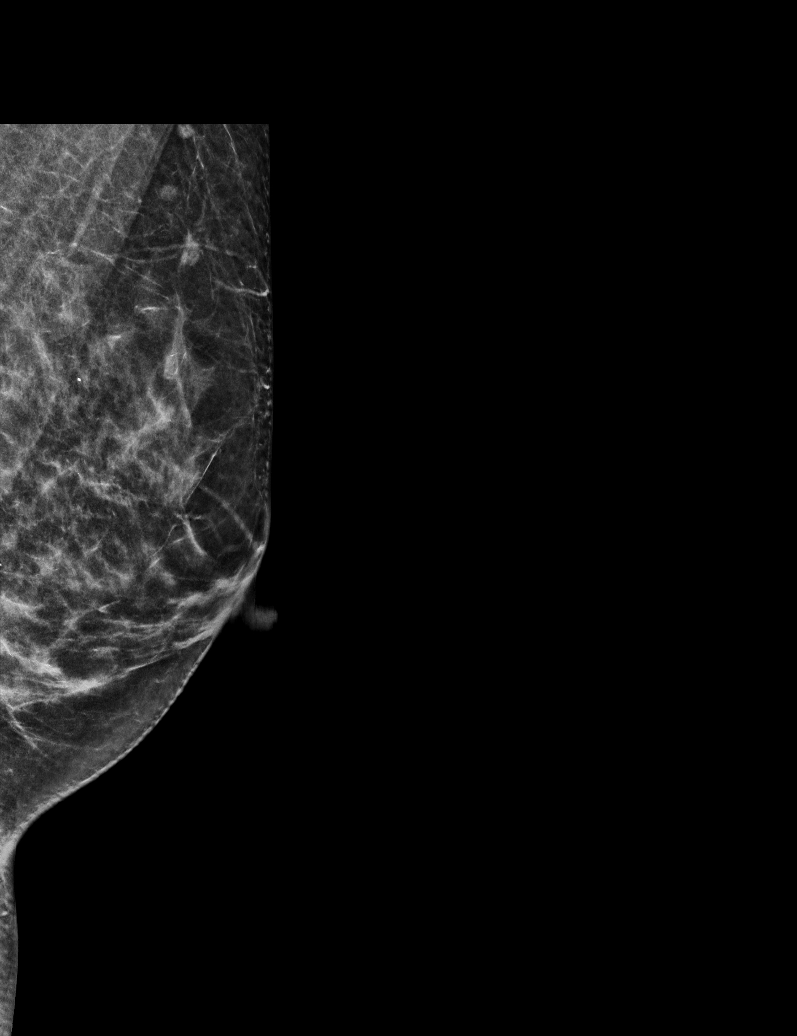

[L CC synth-2D (1 of 2)]
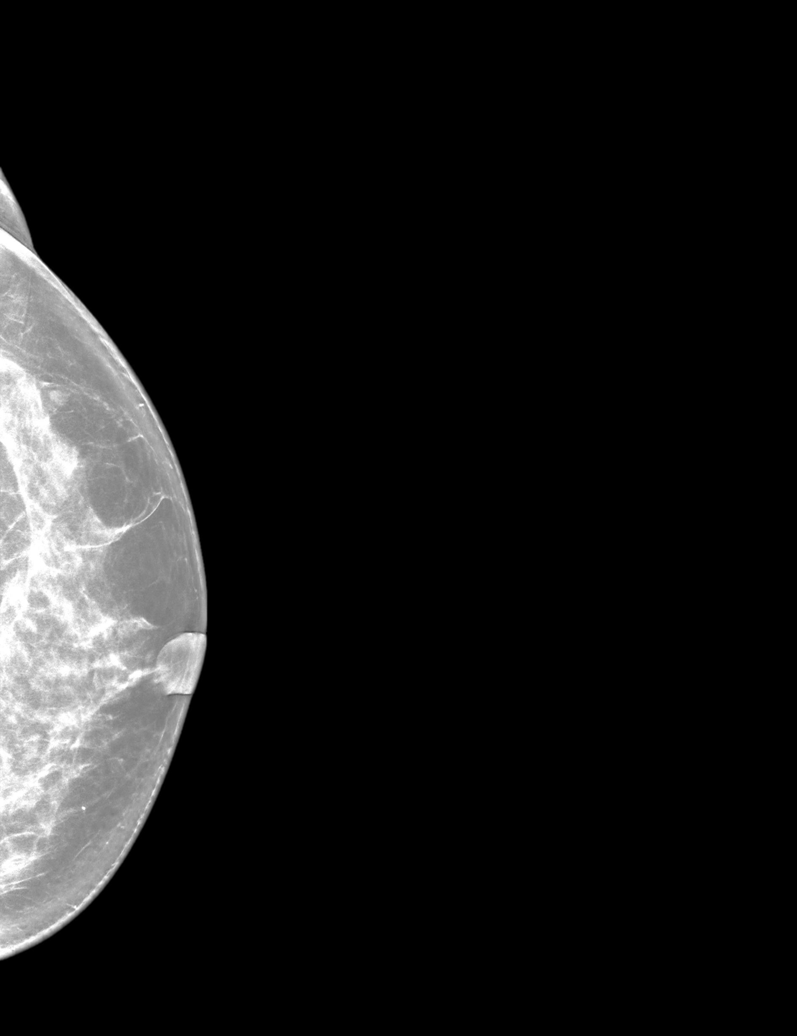

[R CC synth-2D]
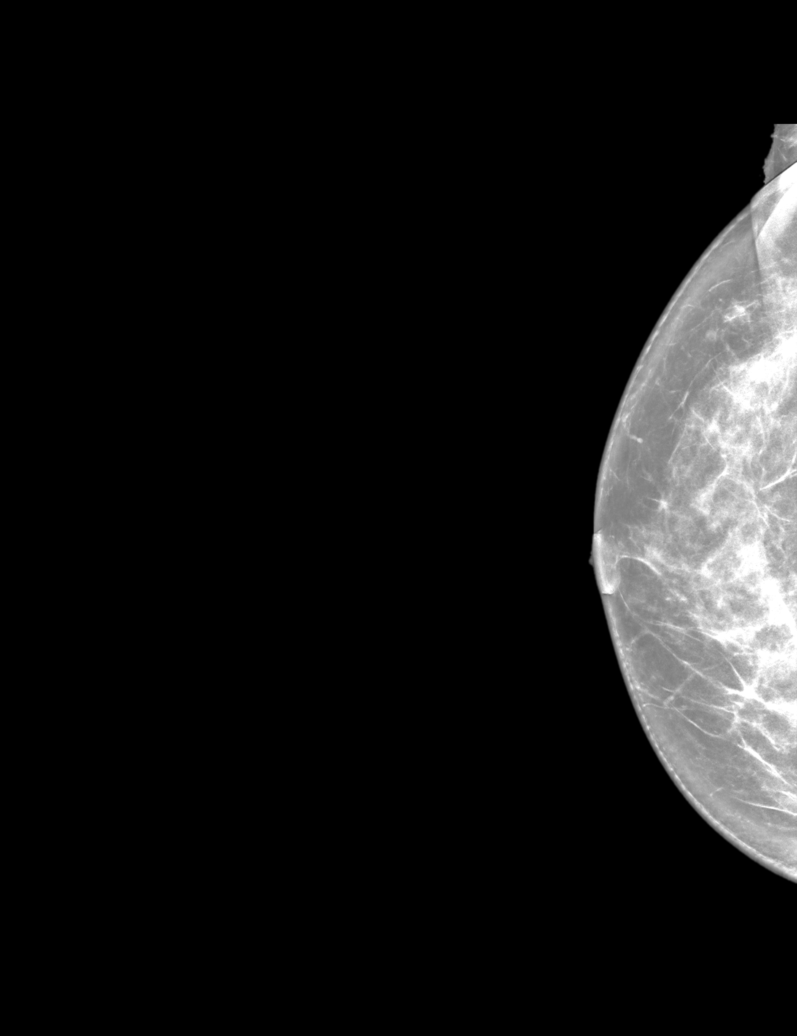

[L CC synth-2D (2 of 2)]
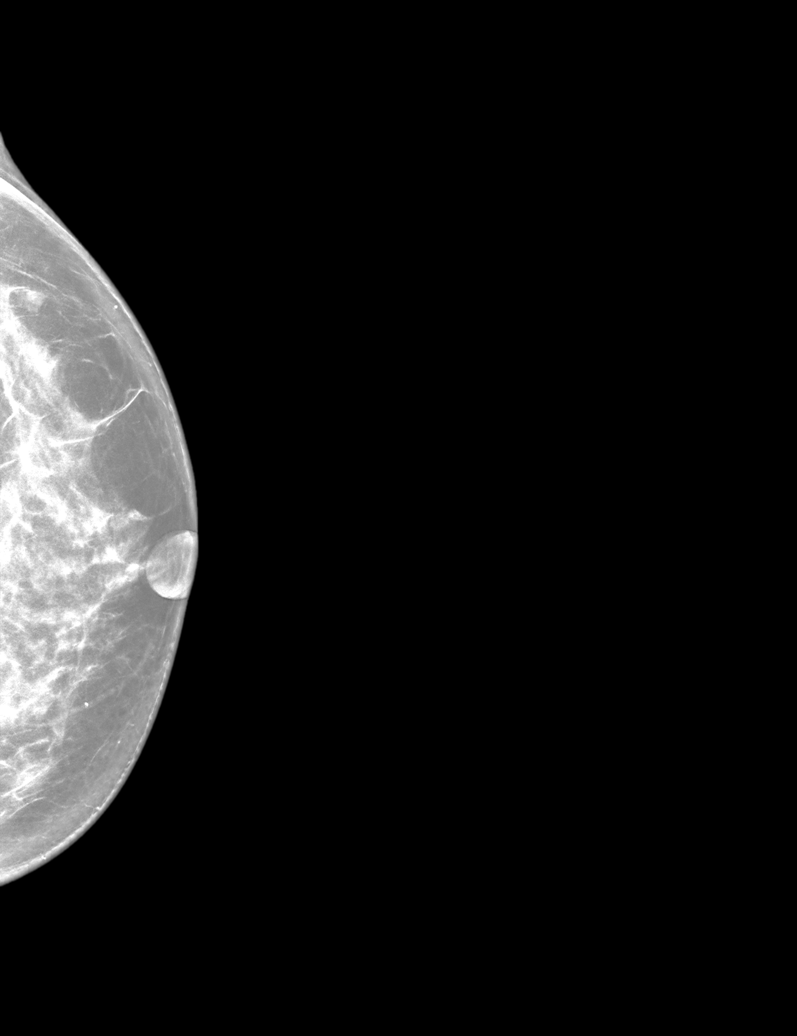

[R MLO synth-2D]
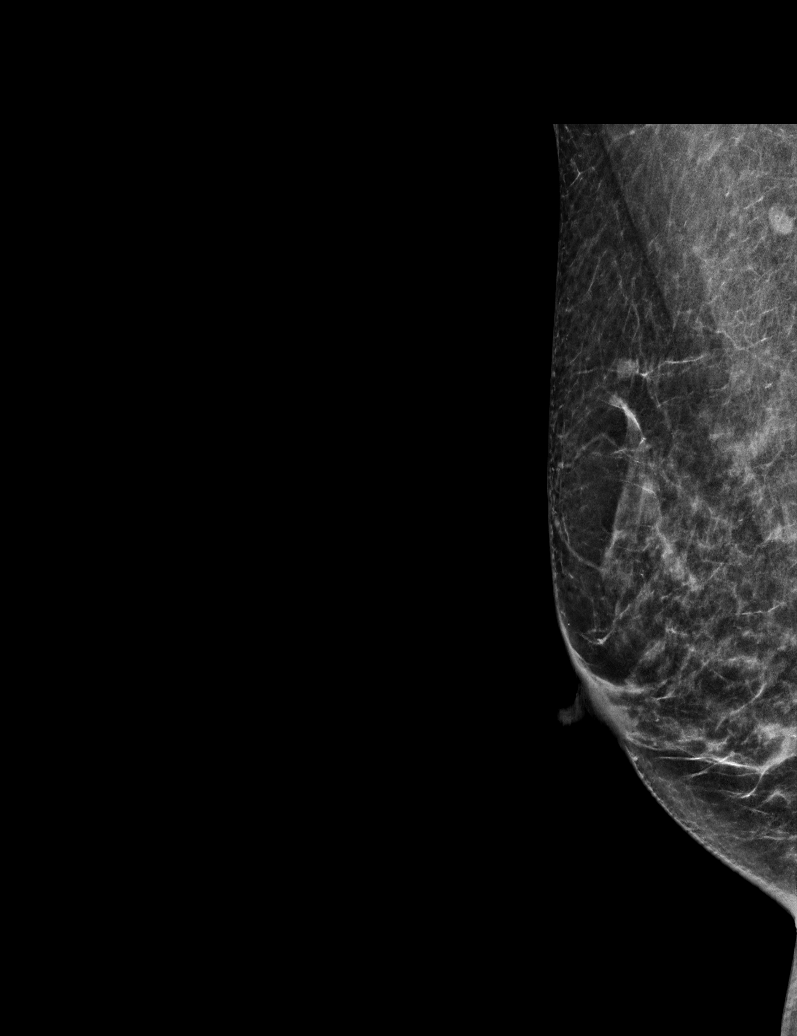

[R XCCL synth-2D]
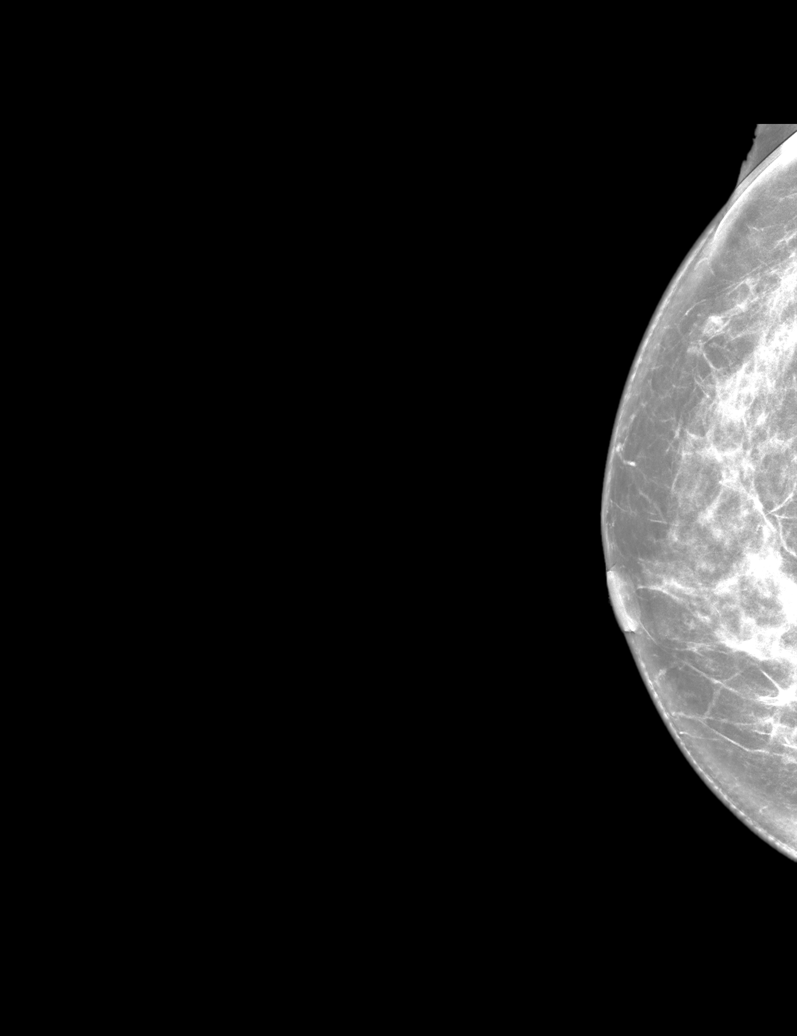

[6 of 36 positions shown; findings below may reference images not displayed]

ACR Breast Density Category c: The breast tissue is heterogeneously
dense, which may obscure small masses.
FINDINGS: There are no findings suspicious for malignancy. The images were
evaluated with computer-aided detection.
IMPRESSION: No mammographic evidence of malignancy. A result letter of this
screening mammogram will be mailed directly to the patient.

RECOMMENDATION:
Screening mammogram in one year. (Code:T4-5-GWO)

BI-RADS CATEGORY  1: Negative.

## 2023-02-25 ENCOUNTER — Ambulatory Visit (INDEPENDENT_AMBULATORY_CARE_PROVIDER_SITE_OTHER): Payer: Managed Care, Other (non HMO) | Admitting: Internal Medicine

## 2023-02-25 ENCOUNTER — Encounter: Payer: Self-pay | Admitting: Internal Medicine

## 2023-02-25 VITALS — BP 120/68 | HR 55 | Temp 98.2°F | Ht 63.0 in | Wt 132.0 lb

## 2023-02-25 DIAGNOSIS — B009 Herpesviral infection, unspecified: Secondary | ICD-10-CM

## 2023-02-25 DIAGNOSIS — Z1231 Encounter for screening mammogram for malignant neoplasm of breast: Secondary | ICD-10-CM | POA: Diagnosis not present

## 2023-02-25 DIAGNOSIS — Z0000 Encounter for general adult medical examination without abnormal findings: Secondary | ICD-10-CM

## 2023-02-25 DIAGNOSIS — Z8619 Personal history of other infectious and parasitic diseases: Secondary | ICD-10-CM | POA: Diagnosis not present

## 2023-02-25 MED ORDER — VALACYCLOVIR HCL 1 G PO TABS: ORAL_TABLET | ORAL | 3 refills | Status: AC

## 2023-02-25 NOTE — Progress Notes (Signed)
   Subjective:   Patient ID: Sheila Cox, female    DOB: [birth year], 62 y.o.   MRN: [id number]  HPI The patient is here for physical.  PMH, [facility], social history reviewed and updated  Review of Systems  Constitutional: Negative.   HENT: Negative.    Eyes: Negative.   Respiratory:  Negative for cough, chest tightness and shortness of breath.   Cardiovascular:  Negative for chest pain, palpitations and leg swelling.  Gastrointestinal:  Negative for abdominal distention, abdominal pain, constipation, diarrhea, nausea and vomiting.  Musculoskeletal: Negative.   Skin: Negative.   Neurological: Negative.   Psychiatric/Behavioral: Negative.      Objective:  Physical Exam Constitutional:      Appearance: She is well-developed.  HENT:     Head: Normocephalic and atraumatic.  Cardiovascular:     Rate and Rhythm: Normal rate and regular rhythm.  Pulmonary:     Effort: Pulmonary effort is normal. No respiratory distress.     Breath sounds: Normal breath sounds. No wheezing or rales.  Abdominal:     General: Bowel sounds are normal. There is no distension.     Palpations: Abdomen is soft.     Tenderness: There is no abdominal tenderness. There is no rebound.  Musculoskeletal:     Cervical back: Normal range of motion.  Skin:    General: Skin is warm and dry.  Neurological:     Mental Status: She is alert and oriented to person, place, and time.     Coordination: Coordination normal.     Vitals:   02/25/23 0907  BP: 120/68  Pulse: (!) 55  Temp: 98.2 F (36.8 C)  TempSrc: Oral  SpO2: 98%  Weight: 132 lb (59.9 kg)  Height: 5' 3"$  (1.6 m)    Assessment & Plan:

## 2023-02-25 NOTE — Assessment & Plan Note (Signed)
Refilled valtrex which she uses prn.

## 2023-02-25 NOTE — Assessment & Plan Note (Signed)
Flu shot up to date. Covid-19 up to date. Shingrix complete. Tetanus up to date. Colonoscopy up to date. Mammogram up to date, pap smear up to date. Counseled about sun safety and mole surveillance. Counseled about the dangers of distracted driving. Given 10 year screening recommendations.

## 2023-02-27 ENCOUNTER — Telehealth: Payer: Managed Care, Other (non HMO) | Admitting: Nurse Practitioner

## 2023-02-27 DIAGNOSIS — M545 Low back pain, unspecified: Secondary | ICD-10-CM | POA: Diagnosis not present

## 2023-02-27 MED ORDER — CYCLOBENZAPRINE HCL 10 MG PO TABS: 10.0000 mg | ORAL_TABLET | Freq: Every day | ORAL | 0 refills | Status: AC

## 2023-02-27 MED ORDER — NAPROXEN 500 MG PO TABS: 500.0000 mg | ORAL_TABLET | Freq: Two times a day (BID) | ORAL | 0 refills | Status: AC

## 2023-02-27 NOTE — Progress Notes (Signed)
We are sorry that you are not feeling well.  Here is how we plan to help!  Based on what you have shared with me it looks like you mostly have acute back pain.  Acute back pain is defined as musculoskeletal pain that can resolve in 1-3 weeks with conservative treatment.  I have prescribed Naprosyn 500 mg take one by mouth twice a day non-steroid anti-inflammatory (NSAID) as well as Flexeril 10 mg before bed as needed which is a muscle relaxer  Some patients experience stomach irritation or in increased heartburn with anti-inflammatory drugs.  Please keep in mind that muscle relaxer's can cause fatigue and should not be taken while at work or driving.  Back pain is very common.  The pain often gets better over time.  The cause of back pain is usually not dangerous.  Most people can learn to manage their back pain on their own.  Home Care Stay active.  Start with short walks on flat ground if you can.  Try to walk farther each day. Do not sit, drive or stand in one place for more than 30 minutes.  Do not stay in bed. Do not avoid exercise or work.  Activity can help your back heal faster. Be careful when you bend or lift an object.  Bend at your knees, keep the object close to you, and do not twist. Sleep on a firm mattress.  Lie on your side, and bend your knees.  If you lie on your back, put a pillow under your knees. Only take medicines as told by your doctor. Put ice on the injured area. Put ice in a plastic bag Place a towel between your skin and the bag Leave the ice on for 15-20 minutes, 3-4 times a day for the first 2-3 days. 210 After that, you can switch between ice and heat packs. Ask your doctor about back exercises or massage. Avoid feeling anxious or stressed.  Find good ways to deal with stress, such as exercise.  Get Help Right Way If: Your pain does not go away with rest or medicine. Your pain does not go away in 1 week. You have new problems. You do not feel well. The pain  spreads into your legs. You cannot control when you poop (bowel movement) or pee (urinate) You feel sick to your stomach (nauseous) or throw up (vomit) You have belly (abdominal) pain. You feel like you may pass out (faint). If you develop a fever.  Make Sure you: Understand these instructions. Will watch your condition Will get help right away if you are not doing well or get worse.  Your e-visit answers were reviewed by a board certified advanced clinical practitioner to complete your personal care plan.  Depending on the condition, your plan could have included both over the counter or prescription medications.  If there is a problem please reply  once you have received a response from your provider.  Your safety is important to [place].  If you have drug allergies check your prescription carefully.    You can use MyChart to ask questions about today's visit, request a non-urgent call back, or ask for a work or school excuse for 24 hours related to this e-Visit. If it has been greater than 24 hours you will need to follow up with your provider, or enter a new e-Visit to address those concerns.  You will get an e-mail in the next two days asking about your experience.  I hope that your  e-visit has been valuable and will speed your recovery. Thank you for using e-visits.   Meds ordered this encounter  Medications   naproxen (NAPROSYN) 500 MG tablet    Sig: Take 1 tablet (500 mg total) by mouth 2 (two) times daily with a meal for 5 days.    Dispense:  10 tablet    Refill:  0   cyclobenzaprine (FLEXERIL) 10 MG tablet    Sig: Take 1 tablet (10 mg total) by mouth at bedtime for 5 days.    Dispense:  5 tablet    Refill:  0     I spent approximately 5 minutes reviewing the patient's history, current symptoms and coordinating their care today.

## 2023-03-08 ENCOUNTER — Other Ambulatory Visit: Payer: Self-pay

## 2023-03-08 ENCOUNTER — Encounter (HOSPITAL_BASED_OUTPATIENT_CLINIC_OR_DEPARTMENT_OTHER): Payer: Self-pay | Admitting: Emergency Medicine

## 2023-03-08 ENCOUNTER — Emergency Department (HOSPITAL_BASED_OUTPATIENT_CLINIC_OR_DEPARTMENT_OTHER)
Admission: EM | Admit: 2023-03-08 | Discharge: 2023-03-08 | Disposition: A | Discharge status: Home / Self Care | Payer: Managed Care, Other (non HMO) | Attending: Emergency Medicine | Admitting: Emergency Medicine

## 2023-03-08 DIAGNOSIS — N189 Chronic kidney disease, unspecified: Secondary | ICD-10-CM | POA: Diagnosis not present

## 2023-03-08 DIAGNOSIS — S3992XA Unspecified injury of lower back, initial encounter: Secondary | ICD-10-CM | POA: Diagnosis present

## 2023-03-08 DIAGNOSIS — Y92096 Garden or yard of other non-institutional residence as the place of occurrence of the external cause: Secondary | ICD-10-CM | POA: Insufficient documentation

## 2023-03-08 DIAGNOSIS — S39012A Strain of muscle, fascia and tendon of lower back, initial encounter: Secondary | ICD-10-CM | POA: Insufficient documentation

## 2023-03-08 DIAGNOSIS — X58XXXA Exposure to other specified factors, initial encounter: Secondary | ICD-10-CM | POA: Insufficient documentation

## 2023-03-08 MED ORDER — DIAZEPAM 5 MG PO TABS: 5.0000 mg | ORAL_TABLET | Freq: Four times a day (QID) | ORAL | 0 refills | Status: DC | PRN

## 2023-03-08 MED ORDER — DIAZEPAM 5 MG PO TABS
5.0000 mg | ORAL_TABLET | Freq: Once | ORAL | Status: AC
  Administered 2023-03-08: 5 mg via ORAL
  Filled 2023-03-08: qty 1

## 2023-03-08 NOTE — ED Triage Notes (Signed)
Per EMS. Pt reports L side low back pain for a week. No known injury, but may have pulled muscle when doing normal work out. Pain better with standing upright; worse with bending. Had virtual doctor's visit on Thursday and was prescribed muscle relaxer which helped. Pain returned last night.

## 2023-03-08 NOTE — ED Provider Notes (Signed)
EMERGENCY DEPARTMENT AT [facility] Provider Note   CSN: [id number] Arrival date & time: 03/08/23  [id number]     History  Chief Complaint  Patient presents with   Back Pain    Sheila Cox is a 62 y.o. female.  The history is provided by the patient and medical records. No language interpreter was used.  Back Pain    62 year old female with significant history chronic kidney disease, depression, migraine, brought here via EMS for evaluation of low back pain.  Patient reported approximately a week ago after doing some yard work and some slight normal workout she noticed some tightness sensation to her lower back.  Since then she has had episodes where her back felt like it was "locked up" with severe tightness worsening pain when she tried to get out of bed or when she bends forward.  She feels improvement of her pain when she stands upright and when she moves around.  She initially had a virtual visit for her symptoms and was prescribed anti-inflammatory medication and muscle relaxant.  She tries taking the medication but states it makes her drowsy and does not seem to help much.  She also went to a chiropractor several days later who and has some spinal manipulation which did seem to help with her symptoms for a few days but last night her pain returned.  She was having a difficult time getting out of bed this morning which concerns her.  She does not endorse any fever or chills no dysuria or hematuria no bowel bladder incontinence or saddle anesthesia.  She does not complain of any radicular pain down her legs no numbness or weakness.  No history of IV drug use or active cancer.  Symptom currently is moderate in severity.  Home Medications Prior to Admission medications   Medication Sig Start Date End Date Taking? Authorizing Provider  cyclobenzaprine (FLEXERIL) 10 MG tablet Take 10 mg by mouth 3 (three) times daily as needed for muscle spasms.   Yes [provider]  ibuprofen (ADVIL) 200 MG tablet Take 400 mg by mouth every 6 (six) hours as needed for moderate pain.   Yes [provider]  Cyanocobalamin (VITAMIN B-12 PO) Take by mouth.    [provider]  timolol (TIMOPTIC) 0.5 % ophthalmic solution Place 1 drop into both eyes in the morning and at bedtime. dr 11/15/20   [provider]  valACYclovir (VALTREX) 1000 MG tablet One tablet 2 times daily x 3 days at onset of outbreak 02/25/23   Hoyt Koch, MD      Allergies    Patient has no known allergies.    Review of Systems   Review of Systems  Musculoskeletal:  Positive for back pain.  All other systems reviewed and are negative.   Physical Exam Updated Vital Signs BP (!) 157/73   Pulse 77   Temp 97.8 F (36.6 C) (Oral)   Resp 16   LMP 03/10/2013   SpO2 100%  Physical Exam Vitals and nursing note reviewed.  Constitutional:      General: She is not in acute distress.    Appearance: She is well-developed.     Comments: Patient was found standing, leaning over the sink appears slightly uncomfortable.  She is able to ambulate.  HENT:     Head: Atraumatic.  Eyes:     Conjunctiva/sclera: Conjunctivae normal.  Cardiovascular:     Rate and Rhythm: Normal rate and regular rhythm.  Pulses: Normal pulses.     Heart sounds: Normal heart sounds.  Pulmonary:     Effort: Pulmonary effort is normal.  Abdominal:     Palpations: Abdomen is soft.     Tenderness: There is no abdominal tenderness.  Musculoskeletal:        General: No tenderness (No significant reproducible tenderness along the midline spine or paraspinal muscle however with flexion he does endorse lower back tenderness.).     Cervical back: Neck supple.     Comments: 5 out of 5 strength to bilateral lower extremities with intact pedal pulses.  Skin:    Findings: No rash.  Neurological:     Mental Status: She is alert.     Comments: Patellar deep tendon reflex intact  bilaterally.  Psychiatric:        Mood and Affect: Mood normal.     ED Results / Procedures / Treatments   Labs (all labs ordered are listed, but only abnormal results are displayed) Labs Reviewed - No data to display  EKG None  Radiology No results found.  Procedures Procedures    Medications Ordered in ED Medications  diazepam (VALIUM) tablet 5 mg (5 mg Oral Given 03/08/23 1036)    ED Course/ Medical Decision Making/ A&P                             Medical Decision Making Risk Prescription drug management.   BP (!) 157/73   Pulse 77   Temp 97.8 F (36.6 C) (Oral)   Resp 16   LMP 03/10/2013   SpO2 100%   69:61 AM  62 year old female with significant history chronic kidney disease, depression, migraine, brought here via EMS for evaluation of low back pain.  Patient reported approximately a week ago after doing some yard work and some slight normal workout she noticed some tightness sensation to her lower back.  Since then she has had episodes where her back felt like it was "locked up" with severe tightness worsening pain when she tried to get out of bed or when she bends forward.  She feels improvement of her pain when she stands upright and when she moves around.  She initially had a virtual visit for her symptoms and was prescribed anti-inflammatory medication and muscle relaxant.  She tries taking the medication but states it makes her drowsy and does not seem to help much.  She also went to a chiropractor several days later who and has some spinal manipulation which did seem to help with her symptoms for a few days but last night her pain returned.  She was having a difficult time getting out of bed this morning which concerns her.  She does not endorse any fever or chills no dysuria or hematuria no bowel bladder incontinence or saddle anesthesia.  She does not complain of any radicular pain down her legs no numbness or weakness.  No history of IV drug use or active  cancer.  Symptom currently is moderate in severity.  On exam, this is a well-appearing female standing leaning slightly over the sink appears slightly uncomfortable.  She is able to ambulate.  She does not have any reproducible midline spine tenderness and no paraspinal muscle tenderness.  No CVA tenderness.  She is able to move her legs with equal strength.  She does have increasing lower back pain when she flexes her lower back.  Vital signs remarkable for elevated blood pressure of 157/73  likely in the setting of pain.  DDx: Lumbosacral strain, low back sprain, sciatica, cauda equina, fracture, cellulitis, discitis, kidney stone  On exam, I felt patient symptoms more likely to be muscle schedule strain.  Have low suspicion for kidney stone, and also have low suspicion for fracture.  Imaging such as lumbar spine MRI and CT scan as well as x-ray was considered but not performed as a symptom is less likely to be acute emergent medical condition.  Patient has no red flags.  Will offer patient a Valium muscle relaxant and reassessment.  11:40 AM On reassessment patient does felt a bit better but not completely resolved.  After further discussion, patient plan on following up with [facility] for close follow-up of her symptoms.  Will also prescribe a short course of Valium as muscle relaxant and gave return precaution.        Final Clinical Impression(s) / ED Diagnoses Final diagnoses:  Strain of lumbar region, initial encounter    Rx / DC Orders ED Discharge Orders          Ordered    diazepam (VALIUM) 5 MG tablet  Every 6 hours PRN        03/08/23 1137              Domenic Moras, PA-C 03/08/23 1141    Gareth Morgan, MD 03/08/23 2136

## 2023-03-08 NOTE — Discharge Instructions (Addendum)
Your low back pain is likely a lumbar strain.  You may continue to take anti-inflammatory medication muscle relaxant as previously prescribed however if your symptoms persist, do not hesitate to follow-up with [facility] or return to the ER for further management of your condition.  Please be aware muscle accident can cause drowsiness.

## 2023-04-21 ENCOUNTER — Telehealth: Payer: Self-pay | Admitting: Internal Medicine

## 2023-04-21 NOTE — Telephone Encounter (Signed)
Patient would like to know if you can order her blood work that you didn't do in March.  She is having some weak spells and would like to have labs done now.  Please let patient know.  Phone:  [phone]

## 2023-04-21 NOTE — Telephone Encounter (Signed)
Called pt inform MD out of the office until Wednesday. Pt states msg not urgent would like to have labs done done before going on vacation. Will be leaving this Friday...Raechel Chute

## 2023-04-23 NOTE — Telephone Encounter (Signed)
I don't see what kind of labs she is requesting done? If new symptoms may need visit to assess so proper things can be ordered

## 2023-05-12 ENCOUNTER — Ambulatory Visit: Payer: Managed Care, Other (non HMO) | Admitting: Internal Medicine

## 2023-05-12 ENCOUNTER — Encounter: Payer: Self-pay | Admitting: Internal Medicine

## 2023-05-12 VITALS — BP 120/80 | HR 53 | Temp 98.2°F | Ht 63.0 in | Wt 130.0 lb

## 2023-05-12 DIAGNOSIS — R5383 Other fatigue: Secondary | ICD-10-CM | POA: Insufficient documentation

## 2023-05-12 DIAGNOSIS — Z0000 Encounter for general adult medical examination without abnormal findings: Secondary | ICD-10-CM | POA: Diagnosis not present

## 2023-05-12 LAB — CBC
HCT: 40.8 % (ref 36.0–46.0)
Hemoglobin: 13.7 g/dL (ref 12.0–15.0)
MCHC: 33.5 g/dL (ref 30.0–36.0)
MCV: 89.2 fl (ref 78.0–100.0)
Platelets: 211 10*3/uL (ref 150.0–400.0)
RBC: 4.58 Mil/uL (ref 3.87–5.11)
RDW: 13.4 % (ref 11.5–15.5)
WBC: 5.1 10*3/uL (ref 4.0–10.5)

## 2023-05-12 LAB — COMPREHENSIVE METABOLIC PANEL
ALT: 20 U/L (ref 0–35)
AST: 20 U/L (ref 0–37)
Albumin: 4.4 g/dL (ref 3.5–5.2)
Alkaline Phosphatase: 66 U/L (ref 39–117)
BUN: 17 mg/dL (ref 6–23)
CO2: 31 mEq/L (ref 19–32)
Calcium: 9.8 mg/dL (ref 8.4–10.5)
Chloride: 104 mEq/L (ref 96–112)
Creatinine, Ser: 0.69 mg/dL (ref 0.40–1.20)
GFR: 94.19 mL/min (ref >60.00)
Glucose, Bld: 91 mg/dL (ref 70–99)
Potassium: 5.1 mEq/L (ref 3.5–5.1)
Sodium: 142 mEq/L (ref 135–145)
Total Bilirubin: 0.9 mg/dL (ref 0.2–1.2)
Total Protein: 7.1 g/dL (ref 6.0–8.3)

## 2023-05-12 LAB — LIPID PANEL
Cholesterol: 187 mg/dL (ref 0–200)
HDL: 53.2 mg/dL (ref >39.00)
LDL Cholesterol: 119 mg/dL — ABNORMAL HIGH (ref 0–99)
NonHDL: 133.89
Total CHOL/HDL Ratio: 4
Triglycerides: 76 mg/dL (ref 0.0–149.0)
VLDL: 15.2 mg/dL (ref 0.0–40.0)

## 2023-05-12 LAB — TSH: TSH: 1.62 u[IU]/mL (ref 0.35–5.50)

## 2023-05-12 LAB — VITAMIN B12: Vitamin B-12: 265 pg/mL (ref 211–911)

## 2023-05-12 LAB — VITAMIN D 25 HYDROXY (VIT D DEFICIENCY, FRACTURES): VITD: 21.9 ng/mL — ABNORMAL LOW (ref 30.00–100.00)

## 2023-05-12 NOTE — Progress Notes (Signed)
   Subjective:   Patient ID: Sheila Cox, female    DOB: Feb 06, 1961, 62 y.o.   MRN: [id number]  HPI The patient is a 62 YO female coming in for new concerns of dizzy episode happened once vertigo like.   Review of Systems  Constitutional: Negative.   HENT: Negative.    Eyes: Negative.   Respiratory:  Negative for cough, chest tightness and shortness of breath.   Cardiovascular:  Negative for chest pain, palpitations and leg swelling.  Gastrointestinal:  Negative for abdominal distention, abdominal pain, constipation, diarrhea, nausea and vomiting.  Musculoskeletal: Negative.   Skin: Negative.   Neurological:  Positive for dizziness.  Psychiatric/Behavioral: Negative.      Objective:  Physical Exam Constitutional:      Appearance: She is well-developed.  HENT:     Head: Normocephalic and atraumatic.  Cardiovascular:     Rate and Rhythm: Normal rate and regular rhythm.  Pulmonary:     Effort: Pulmonary effort is normal. No respiratory distress.     Breath sounds: Normal breath sounds. No wheezing or rales.  Abdominal:     General: Bowel sounds are normal. There is no distension.     Palpations: Abdomen is soft.     Tenderness: There is no abdominal tenderness. There is no rebound.  Musculoskeletal:     Cervical back: Normal range of motion.  Skin:    General: Skin is warm and dry.  Neurological:     Mental Status: She is alert and oriented to person, place, and time.     Coordination: Coordination normal.     Vitals:   05/12/23 0844  BP: 120/80  Pulse: (!) 53  Temp: 98.2 F (36.8 C)  TempSrc: Oral  SpO2: 98%  Weight: 130 lb (59 kg)  Height: 5\' 3"  (1.6 m)    Assessment & Plan:

## 2023-05-12 NOTE — Assessment & Plan Note (Signed)
Checking TSH and B12 and vitamin D today and CMP and CBC. She had 1 episode of dizziness with some fatigue. Now recovered. Adjust as indicated by labs.

## 2023-11-03 ENCOUNTER — Inpatient Hospital Stay
Admission: RE | Admit: 2023-11-03 | Discharge: 2023-11-03 | Discharge status: Home / Self Care | Payer: Managed Care, Other (non HMO) | Source: Ambulatory Visit | Attending: Internal Medicine | Admitting: Internal Medicine

## 2023-11-03 DIAGNOSIS — Z1231 Encounter for screening mammogram for malignant neoplasm of breast: Secondary | ICD-10-CM

## 2023-11-04 LAB — HM MAMMOGRAPHY

## 2023-11-07 ENCOUNTER — Encounter: Payer: Self-pay | Admitting: Internal Medicine

## 2024-01-26 LAB — HM DEXA SCAN
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
## 2024-02-11 ENCOUNTER — Ambulatory Visit: Payer: Managed Care, Other (non HMO) | Admitting: Family Medicine

## 2024-02-11 VITALS — BP 116/76 | HR 59 | Ht 62.5 in | Wt 135.0 lb

## 2024-02-11 DIAGNOSIS — E559 Vitamin D deficiency, unspecified: Secondary | ICD-10-CM | POA: Diagnosis not present

## 2024-02-11 DIAGNOSIS — M810 Age-related osteoporosis without current pathological fracture: Secondary | ICD-10-CM

## 2024-02-11 DIAGNOSIS — Z87442 Personal history of urinary calculi: Secondary | ICD-10-CM | POA: Diagnosis not present

## 2024-02-11 NOTE — Patient Instructions (Addendum)
Thank you for coming in today.   Please get labs today before you leave   OsteoStrong
